# Patient Record
Sex: Male | Born: 1965 | Race: White | Hispanic: No | Marital: Single | Smoking: Light tobacco smoker
Health system: Southern US, Community
[De-identification: ages and names within clinical notes are randomized; demographics above are authoritative.]

## PROBLEM LIST (undated history)

## (undated) DIAGNOSIS — R260 Ataxic gait: Secondary | ICD-10-CM

## (undated) DIAGNOSIS — G7111 Myotonic muscular dystrophy: Principal | ICD-10-CM

## (undated) HISTORY — DX: Ataxic gait: R26.0

## (undated) HISTORY — DX: Myotonic muscular dystrophy: G71.11

## (undated) HISTORY — PX: CATARACT EXTRACTION: SUR2

---

## 1998-08-19 ENCOUNTER — Emergency Department (HOSPITAL_COMMUNITY): Admission: EM | Admit: 1998-08-19 | Discharge: 1998-08-19 | Payer: Self-pay | Admitting: Emergency Medicine

## 1998-08-19 ENCOUNTER — Encounter: Payer: Self-pay | Admitting: Emergency Medicine

## 1998-08-23 ENCOUNTER — Emergency Department (HOSPITAL_COMMUNITY): Admission: EM | Admit: 1998-08-23 | Discharge: 1998-08-23 | Payer: Self-pay | Admitting: Emergency Medicine

## 1998-08-29 ENCOUNTER — Emergency Department (HOSPITAL_COMMUNITY): Admission: EM | Admit: 1998-08-29 | Discharge: 1998-08-29 | Payer: Self-pay | Admitting: Emergency Medicine

## 1998-09-22 ENCOUNTER — Emergency Department (HOSPITAL_COMMUNITY): Admission: EM | Admit: 1998-09-22 | Discharge: 1998-09-22 | Payer: Self-pay | Admitting: Internal Medicine

## 2014-04-12 ENCOUNTER — Encounter: Payer: Self-pay | Admitting: *Deleted

## 2014-04-14 ENCOUNTER — Encounter: Payer: Self-pay | Admitting: Neurology

## 2014-04-14 ENCOUNTER — Ambulatory Visit (INDEPENDENT_AMBULATORY_CARE_PROVIDER_SITE_OTHER): Payer: BC Managed Care – PPO | Admitting: Neurology

## 2014-04-14 VITALS — BP 110/75 | HR 91 | Ht 68.0 in | Wt 162.0 lb

## 2014-04-14 DIAGNOSIS — M6281 Muscle weakness (generalized): Secondary | ICD-10-CM

## 2014-04-14 DIAGNOSIS — R269 Unspecified abnormalities of gait and mobility: Secondary | ICD-10-CM

## 2014-04-14 DIAGNOSIS — R260 Ataxic gait: Secondary | ICD-10-CM | POA: Insufficient documentation

## 2014-04-14 HISTORY — DX: Ataxic gait: R26.0
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
## 2014-04-14 NOTE — Patient Instructions (Signed)
Probable Myotonic dystrophy

## 2014-04-15 DIAGNOSIS — Z0289 Encounter for other administrative examinations: Secondary | ICD-10-CM

## 2014-04-15 LAB — TSH: TSH: 2.52 u[IU]/mL (ref 0.450–4.500)

## 2014-04-15 LAB — ANGIOTENSIN CONVERTING ENZYME: Angio Convert Enzyme: 37 U/L (ref 14–82)

## 2014-04-15 LAB — CK: Total CK: 175 U/L (ref 24–204)

## 2014-04-15 LAB — VITAMIN B12: Vitamin B-12: 304 pg/mL (ref 211–946)

## 2014-04-18 ENCOUNTER — Telehealth: Payer: Self-pay | Admitting: Neurology

## 2014-04-18 NOTE — Telephone Encounter (Signed)
I called patient. The patient wanted to know that I filled out the [facility] form. I did this, it should be sent in. I believe that he has myotonic dystrophy, but his CK enzyme level came back normal, putting this diagnosis in question. The patient will be set up for EMG evaluation.

## 2014-04-18 NOTE — Progress Notes (Signed)
Quick Note:  Called and shared unremarkable results with patient thru VM message ______ 

## 2014-04-20 DIAGNOSIS — Z0289 Encounter for other administrative examinations: Secondary | ICD-10-CM

## 2014-04-28 ENCOUNTER — Encounter: Payer: Self-pay | Admitting: *Deleted

## 2014-04-28 ENCOUNTER — Telehealth: Payer: Self-pay | Admitting: Neurology

## 2014-04-28 NOTE — Telephone Encounter (Signed)
Patient stated paperwork given to him at check has incorrect information.  States he's a current everyday smoker, but he states he smokes rarely.  He has additional questions regarding dx's.  Please return call anytime and can leave message if not available. s

## 2014-04-28 NOTE — Telephone Encounter (Signed)
Called patient back needing more information left patient a voice mail to call me back.

## 2014-04-28 NOTE — Telephone Encounter (Signed)
Patient called me back and he stated that he wanted his smoking history change to light smoker this has been done.

## 2014-06-30 ENCOUNTER — Telehealth: Payer: Self-pay | Admitting: *Deleted

## 2014-06-30 ENCOUNTER — Encounter: Payer: Self-pay | Admitting: Neurology

## 2014-06-30 NOTE — Telephone Encounter (Signed)
I called patient. The patient wants a letter concerning his diagnosis of myotonic dystrophy. The patient never came back for EMG evaluation to confirm the diagnosis. The diagnosis of myotonic history is a presumptive diagnosis only. I will dictate a letter.

## 2014-06-30 NOTE — Telephone Encounter (Signed)
Patient calling stating that he needs a letter stating his condition and how it affects him, mainly his walking ability. Please advise

## 2014-07-01 ENCOUNTER — Telehealth: Payer: Self-pay | Admitting: Neurology

## 2014-07-01 NOTE — Telephone Encounter (Signed)
Patient calling to state that he needs a letter from Dr. Anne HahnWillis stating that he is unable to walk in a straight line due to his condition. Please return call and advise.

## 2014-07-01 NOTE — Telephone Encounter (Signed)
A letter was already written, the patient is to come in and pick up the letter.

## 2014-11-07 ENCOUNTER — Telehealth: Payer: Self-pay | Admitting: Neurology

## 2014-11-07 NOTE — Telephone Encounter (Signed)
Patient stated he hadn't completed diagnostic testing for diagnosis.  Questioning when should he return.  Please call and advise.

## 2014-11-08 NOTE — Telephone Encounter (Signed)
Spoke to patient and relayed that he needs to schedule his EMG/NCV study to confirm diagnosis of myotonic dystrophy, per Dr. Anne HahnWillis.  The patient stated he is going out of town tomorrow and will call to schedule study when he returns.

## 2014-11-24 ENCOUNTER — Telehealth: Payer: Self-pay | Admitting: *Deleted

## 2014-11-24 NOTE — Telephone Encounter (Signed)
Patient calling stating that he wants to be reevaluated for his condition. Patient did not have the EMG done that was ordered. Patient is a little confused ans would like a call back. Please advise.

## 2014-11-25 NOTE — Telephone Encounter (Signed)
Left message to call office and schedule a NCV/EMG study first,  per Dr. Anne HahnWillis.  After the procedure the doctor can see him for an office visit.

## 2014-11-29 NOTE — Telephone Encounter (Signed)
EMG scheduled for 12/13/14.

## 2014-12-13 ENCOUNTER — Encounter: Payer: Self-pay | Admitting: Neurology

## 2014-12-13 ENCOUNTER — Ambulatory Visit (INDEPENDENT_AMBULATORY_CARE_PROVIDER_SITE_OTHER): Payer: Self-pay | Admitting: Neurology

## 2014-12-13 ENCOUNTER — Ambulatory Visit (INDEPENDENT_AMBULATORY_CARE_PROVIDER_SITE_OTHER): Payer: BLUE CROSS/BLUE SHIELD | Admitting: Neurology

## 2014-12-13 DIAGNOSIS — G7111 Myotonic muscular dystrophy: Secondary | ICD-10-CM

## 2014-12-13 DIAGNOSIS — R260 Ataxic gait: Secondary | ICD-10-CM

## 2014-12-13 DIAGNOSIS — M6281 Muscle weakness (generalized): Secondary | ICD-10-CM

## 2014-12-13 HISTORY — DX: Myotonic muscular dystrophy: G71.11

## 2014-12-13 NOTE — Procedures (Signed)
     HISTORY:  Mitchell Maldonado is a 49 year old gentleman with a history of progressive weakness of the extremities, gait disorder, dysarthric speech. The patient has clinical evidence of myotonia, difficulty with relaxation after strong muscle contraction. The patient is being evaluated for possible myotonic dystrophy.  NERVE CONDUCTION STUDIES:  Nerve conduction studies were performed on the right upper extremity. The distal motor latencies and motor amplitudes for the median and ulnar nerves were within normal limits. The F wave latencies and nerve conduction velocities for these nerves were also normal. The sensory latencies for the median and ulnar nerves were normal.  Nerve conduction studies were performed on the right lower extremity. The distal motor latencies and motor amplitudes for the peroneal and posterior tibial nerves were within normal limits. The nerve conduction velocities for these nerves were also normal. The F wave latencies for the peroneal and posterior tibial nerves were normal. The sensory latency for the peroneal nerve was within normal limits.   EMG STUDIES:  A limited EMG study was performed on the right upper extremity:  The first dorsal interosseous muscle reveals 2 to 4 K units with full recruitment. Myotonia was seen. The abductor pollicis brevis muscle reveals 2 to 4 K units with full recruitment. Myotonia was seen. The extensor indicis proprius muscle reveals 1 to 3 K units with full recruitment. Myotonia was seen.   IMPRESSION:  Nerve conduction studies done on the right upper and right lower extremities were relatively unremarkable, without evidence of a peripheral neuropathy. EMG evaluation of the right upper extremity was limited, and confirm the presence of myotonia, consistent with the diagnosis of myotonic dystrophy.  Marlan Palau. Keith Willis MD 12/13/2014 1:57 PM  [facility] [address] [place], [place]  [id number]  Phone [phone] Fax [phone]

## 2014-12-13 NOTE — Progress Notes (Signed)
Please refer to EMG and nerve conduction study procedure note. 

## 2014-12-13 NOTE — Progress Notes (Signed)
Mitchell Maldonado comes in today for EMG nerve conduction study. The study confirms myotonia consistent with the diagnosis of myotonic dystrophy.  The patient should follow-up on a regular basis with his primary care physician to check EKG evaluation, individuals of myotonic dystrophy may be at risk for cardiac rhythm abnormalities, particularly atrial fibrillation.

## 2015-02-16 ENCOUNTER — Telehealth: Payer: Self-pay | Admitting: Neurology

## 2015-02-16 NOTE — Telephone Encounter (Signed)
Error

## 2015-04-10 ENCOUNTER — Telehealth: Payer: Self-pay | Admitting: Neurology

## 2015-04-10 NOTE — Telephone Encounter (Signed)
Patient is inquiring which type of MD does he have... 1 or 2. Please call and advise. Patient can be reached at [phone].

## 2015-04-10 NOTE — Telephone Encounter (Signed)
I called the patient and informed him it is type 1.

## 2016-04-16 DIAGNOSIS — R152 Fecal urgency: Secondary | ICD-10-CM | POA: Diagnosis not present

## 2016-04-18 DIAGNOSIS — R197 Diarrhea, unspecified: Secondary | ICD-10-CM | POA: Diagnosis not present

## 2016-04-18 DIAGNOSIS — R152 Fecal urgency: Secondary | ICD-10-CM | POA: Diagnosis not present

## 2016-04-18 DIAGNOSIS — R159 Full incontinence of feces: Secondary | ICD-10-CM | POA: Diagnosis not present

## 2016-04-22 DIAGNOSIS — R159 Full incontinence of feces: Secondary | ICD-10-CM | POA: Diagnosis not present

## 2016-10-29 DIAGNOSIS — G7111 Myotonic muscular dystrophy: Secondary | ICD-10-CM | POA: Diagnosis not present

## 2016-10-31 DIAGNOSIS — Z139 Encounter for screening, unspecified: Secondary | ICD-10-CM | POA: Diagnosis not present

## 2016-11-01 DIAGNOSIS — H5212 Myopia, left eye: Secondary | ICD-10-CM | POA: Diagnosis not present

## 2016-11-20 DIAGNOSIS — H01001 Unspecified blepharitis right upper eyelid: Secondary | ICD-10-CM | POA: Diagnosis not present

## 2017-08-01 DIAGNOSIS — J3089 Other allergic rhinitis: Secondary | ICD-10-CM | POA: Diagnosis not present

## 2017-08-01 DIAGNOSIS — R05 Cough: Secondary | ICD-10-CM | POA: Diagnosis not present

## 2018-03-20 DIAGNOSIS — Z0000 Encounter for general adult medical examination without abnormal findings: Secondary | ICD-10-CM | POA: Diagnosis not present

## 2018-03-20 DIAGNOSIS — Z13220 Encounter for screening for lipoid disorders: Secondary | ICD-10-CM | POA: Diagnosis not present

## 2018-03-20 DIAGNOSIS — Z8042 Family history of malignant neoplasm of prostate: Secondary | ICD-10-CM | POA: Diagnosis not present

## 2018-03-20 DIAGNOSIS — H6123 Impacted cerumen, bilateral: Secondary | ICD-10-CM | POA: Diagnosis not present

## 2018-11-04 DIAGNOSIS — G7111 Myotonic muscular dystrophy: Secondary | ICD-10-CM | POA: Diagnosis not present

## 2019-07-13 DIAGNOSIS — H524 Presbyopia: Secondary | ICD-10-CM | POA: Diagnosis not present

## 2019-07-13 DIAGNOSIS — H35373 Puckering of macula, bilateral: Secondary | ICD-10-CM | POA: Diagnosis not present

## 2021-01-26 ENCOUNTER — Other Ambulatory Visit: Payer: Self-pay

## 2021-01-26 ENCOUNTER — Emergency Department (HOSPITAL_COMMUNITY)
Admission: EM | Admit: 2021-01-26 | Discharge: 2021-01-27 | Disposition: A | Discharge status: Home / Self Care | Payer: BC Managed Care – PPO | Attending: Emergency Medicine | Admitting: Emergency Medicine

## 2021-01-26 DIAGNOSIS — J984 Other disorders of lung: Secondary | ICD-10-CM | POA: Diagnosis not present

## 2021-01-26 DIAGNOSIS — R0602 Shortness of breath: Secondary | ICD-10-CM | POA: Diagnosis not present

## 2021-01-26 DIAGNOSIS — I499 Cardiac arrhythmia, unspecified: Secondary | ICD-10-CM | POA: Diagnosis not present

## 2021-01-26 DIAGNOSIS — R079 Chest pain, unspecified: Secondary | ICD-10-CM | POA: Diagnosis not present

## 2021-01-26 DIAGNOSIS — F17210 Nicotine dependence, cigarettes, uncomplicated: Secondary | ICD-10-CM | POA: Insufficient documentation

## 2021-01-26 DIAGNOSIS — K8020 Calculus of gallbladder without cholecystitis without obstruction: Secondary | ICD-10-CM | POA: Diagnosis not present

## 2021-01-26 DIAGNOSIS — R112 Nausea with vomiting, unspecified: Secondary | ICD-10-CM | POA: Diagnosis not present

## 2021-01-26 DIAGNOSIS — I491 Atrial premature depolarization: Secondary | ICD-10-CM | POA: Diagnosis not present

## 2021-01-26 DIAGNOSIS — R0789 Other chest pain: Secondary | ICD-10-CM | POA: Diagnosis not present

## 2021-01-26 DIAGNOSIS — R1084 Generalized abdominal pain: Secondary | ICD-10-CM | POA: Diagnosis not present

## 2021-01-26 DIAGNOSIS — R109 Unspecified abdominal pain: Secondary | ICD-10-CM | POA: Diagnosis not present

## 2021-01-26 NOTE — ED Triage Notes (Signed)
Patient BIB [facility] for chest pain and shortness of breath, denies medical hx or daily medications

## 2021-01-26 NOTE — ED Provider Notes (Signed)
MSE was initiated and I personally evaluated the patient and placed orders (if any) at  11:19 PM on Jan 26, 2021.  Chest pain at rest one hour ago. Getting better after taking "stomache relief". Had nausea with dry heaving. Radiated to the back. No heart history. No recent fever or cough.   RRR Lungs clear  The patient appears stable so that the remainder of the MSE may be completed by another provider.   Elpidio Anis, PA-C 01/26/21 2322    Gilda Crease, MD 01/27/21 [phone]

## 2021-01-27 ENCOUNTER — Emergency Department (HOSPITAL_COMMUNITY): Payer: BC Managed Care – PPO

## 2021-01-27 DIAGNOSIS — J984 Other disorders of lung: Secondary | ICD-10-CM | POA: Diagnosis not present

## 2021-01-27 DIAGNOSIS — K8020 Calculus of gallbladder without cholecystitis without obstruction: Secondary | ICD-10-CM | POA: Diagnosis not present

## 2021-01-27 DIAGNOSIS — R109 Unspecified abdominal pain: Secondary | ICD-10-CM | POA: Diagnosis not present

## 2021-01-27 DIAGNOSIS — R079 Chest pain, unspecified: Secondary | ICD-10-CM | POA: Diagnosis not present

## 2021-01-27 LAB — COMPREHENSIVE METABOLIC PANEL
ALT: 29 U/L (ref 0–44)
AST: 58 U/L — ABNORMAL HIGH (ref 15–41)
Albumin: 3.7 g/dL (ref 3.5–5.0)
Alkaline Phosphatase: 100 U/L (ref 38–126)
Anion gap: 8 (ref 5–15)
BUN: 12 mg/dL (ref 6–20)
CO2: 28 mmol/L (ref 22–32)
Calcium: 9.2 mg/dL (ref 8.9–10.3)
Chloride: 107 mmol/L (ref 98–111)
Creatinine, Ser: 0.94 mg/dL (ref 0.61–1.24)
GFR, Estimated: >60 mL/min (ref >60)
Glucose, Bld: 136 mg/dL — ABNORMAL HIGH (ref 70–99)
Potassium: 4 mmol/L (ref 3.5–5.1)
Sodium: 143 mmol/L (ref 135–145)
Total Bilirubin: 0.8 mg/dL (ref 0.3–1.2)
Total Protein: 6.8 g/dL (ref 6.5–8.1)

## 2021-01-27 LAB — CBC WITH DIFFERENTIAL/PLATELET
Abs Immature Granulocytes: 0.04 10*3/uL (ref 0.00–0.07)
Basophils Absolute: 0 10*3/uL (ref 0.0–0.1)
Basophils Relative: 0 %
Eosinophils Absolute: 0 10*3/uL (ref 0.0–0.5)
Eosinophils Relative: 1 %
HCT: 46.2 % (ref 39.0–52.0)
Hemoglobin: 15.3 g/dL (ref 13.0–17.0)
Immature Granulocytes: 1 %
Lymphocytes Relative: 13 %
Lymphs Abs: 1.1 10*3/uL (ref 0.7–4.0)
MCH: 28.2 pg (ref 26.0–34.0)
MCHC: 33.1 g/dL (ref 30.0–36.0)
MCV: 85.1 fL (ref 80.0–100.0)
Monocytes Absolute: 0.3 10*3/uL (ref 0.1–1.0)
Monocytes Relative: 4 %
Neutro Abs: 7.2 10*3/uL (ref 1.7–7.7)
Neutrophils Relative %: 81 %
Platelets: 185 10*3/uL (ref 150–400)
RBC: 5.43 MIL/uL (ref 4.22–5.81)
RDW: 12.7 % (ref 11.5–15.5)
WBC: 8.8 10*3/uL (ref 4.0–10.5)
nRBC: 0 % (ref 0.0–0.2)

## 2021-01-27 LAB — TROPONIN I (HIGH SENSITIVITY)
Troponin I (High Sensitivity): 7 ng/L (ref <18)
Troponin I (High Sensitivity): 10 ng/L (ref <18)

## 2021-01-27 LAB — LIPASE, BLOOD: Lipase: 24 U/L (ref 11–51)

## 2021-01-27 MED ORDER — IOHEXOL 350 MG/ML SOLN
75.0000 mL | Freq: Once | INTRAVENOUS | Status: AC | PRN
  Administered 2021-01-27: 75 mL via INTRAVENOUS

## 2021-01-27 MED ORDER — ONDANSETRON HCL 4 MG/2ML IJ SOLN
4.0000 mg | Freq: Once | INTRAMUSCULAR | Status: AC
  Administered 2021-01-27: 4 mg via INTRAVENOUS
  Filled 2021-01-27: qty 2

## 2021-01-27 MED ORDER — SODIUM CHLORIDE 0.9 % IV BOLUS
500.0000 mL | Freq: Once | INTRAVENOUS | Status: AC
  Administered 2021-01-27: 500 mL via INTRAVENOUS

## 2021-01-27 NOTE — ED Notes (Signed)
Pt given scrubs pants

## 2021-01-27 NOTE — ED Provider Notes (Signed)
[facility] Provider Note   CSN: [id number] Arrival date & time: 01/26/21  2317     History Chief Complaint  Patient presents with  . Chest Pain    Mitchell Maldonado is a 55 y.o. male.  Patient presents to the emergency department multiple complaints.  Patient reports that he started feeling sick to his stomach last night.  He has had nausea, vomiting and dry heaves through the course of today.  He reports that his stomach is uncomfortable, but no specific pain.  He has not had any diarrhea.  Patient reports that he developed pain more up into his chest and back tonight, associated with shortness of breath.  Nausea, abdominal discomfort, chest pain, shortness of breath of improved or resolved but he is still experiencing pain in the back.        Past Medical History:  Diagnosis Date  . 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 CATARACT EXTRACTION Bilateral        Family History  Problem Relation Age of Onset  . Prostate cancer Father   . Breast cancer Mother     Social History   Tobacco Use  . Smoking status: Light Tobacco Smoker    Types: Cigarettes  . Smokeless tobacco: Never Used  . Tobacco comment: 3 packs per year  Substance Use Topics  . Alcohol use: Yes    Comment: occasional  . Drug use: No    Home Medications Prior to Admission medications   Medication Sig Start Date End Date Taking? Authorizing Provider  Bismuth Subsalicylate (STOMACH RELIEF PO) Take 3 tablets by mouth daily as needed (stomach pain).   Yes [provider]    Allergies    Ham  Review of Systems   Review of Systems  Respiratory: Positive for shortness of breath.   Cardiovascular: Positive for chest pain.   Gastrointestinal: Positive for abdominal pain, nausea and vomiting.  All other systems reviewed and are negative.   Physical Exam Updated Vital Signs BP 125/73 (BP Location: Right Arm)   Pulse 73   Temp 97.9 F (36.6 C) (Oral)   Resp (!) 23   Ht 5\' 8"  (1.727 m)   Wt 73.5 kg   SpO2 98%   BMI 24.64 kg/m   Physical Exam Vitals and nursing note reviewed.  Constitutional:      General: He is not in acute distress.    Appearance: Normal appearance. He is well-developed.  HENT:     Head: Normocephalic and atraumatic.     Right Ear: Hearing normal.     Left Ear: Hearing normal.     Nose: Nose normal.  Eyes:     Conjunctiva/sclera: Conjunctivae normal.     Pupils: Pupils are equal, round, and reactive to light.  Cardiovascular:     Rate and Rhythm: Regular rhythm.     Heart sounds: S1 normal and S2 normal. No murmur heard. No friction rub. No gallop.   Pulmonary:     Effort: Pulmonary effort is normal. No respiratory distress.     Breath sounds: Normal breath sounds.  Chest:     Chest wall: No tenderness.  Abdominal:     General: Bowel sounds are normal.     Palpations:  Abdomen is soft.     Tenderness: There is no abdominal tenderness. There is no guarding or rebound. Negative signs include Murphy's sign and McBurney's sign.     Hernia: No hernia is present.  Musculoskeletal:        General: Normal range of motion.     Cervical back: Normal range of motion and neck supple.  Skin:    General: Skin is warm and dry.     Findings: No rash.  Neurological:     Mental Status: He is alert and oriented to person, place, and time.     GCS: GCS eye subscore is 4. GCS verbal subscore is 5. GCS motor subscore is 6.     Cranial Nerves: No cranial nerve deficit.     Sensory: No sensory deficit.     Coordination: Coordination normal.  Psychiatric:        Speech: Speech normal.        Behavior: Behavior normal.        Thought Content: Thought content normal.     ED Results /  Procedures / Treatments   Labs (all labs ordered are listed, but only abnormal results are displayed) Labs Reviewed  COMPREHENSIVE METABOLIC PANEL - Abnormal; Notable for the following components:      Result Value   Glucose, Bld 136 (*)    AST 58 (*)    All other components within normal limits  CBC WITH DIFFERENTIAL/PLATELET  LIPASE, BLOOD  TROPONIN I (HIGH SENSITIVITY)  TROPONIN I (HIGH SENSITIVITY)    EKG EKG Interpretation  Date/Time:  Friday Jan 26 2021 23:28:32 EDT Ventricular Rate:  82 PR Interval:    QRS Duration: 90 QT Interval:  392 QTC Calculation: 457 R Axis:   -19 Text Interpretation: Undetermined rhythm Nonspecific T wave abnormality Abnormal ECG No previous tracing Reconfirmed by Gilda Crease [phone]) on 01/27/2021 7:45:27 AM   Radiology DG Chest 2 View  Result Date: 01/27/2021 CLINICAL DATA:  Chest pain EXAM: CHEST - 2 VIEW COMPARISON:  November 23, 2014 FINDINGS: The heart size and mediastinal contours are within normal limits. Left basilar linear atelectasis versus scarring. No focal consolidation. No pleural effusion. No pneumothorax. The visualized skeletal structures are unremarkable. IMPRESSION: No active cardiopulmonary disease. Left basilar linear atelectasis versus scarring. Electronically Signed   By: Maudry Mayhew MD   On: 01/27/2021 00:32   CT ANGIO CHEST/ABD/PEL FOR DISSECTION W &/OR WO CONTRAST  Result Date: 01/27/2021 CLINICAL DATA:  Abdominal pain aortic dissection suspected EXAM: CT ANGIOGRAPHY CHEST, ABDOMEN AND PELVIS TECHNIQUE: Non-contrast CT of the chest was initially obtained. Multidetector CT imaging through the chest, abdomen and pelvis was performed using the standard protocol during bolus administration of intravenous contrast. Multiplanar reconstructed images and MIPs were obtained and reviewed to evaluate the vascular anatomy. CONTRAST:  52mL OMNIPAQUE IOHEXOL 350 MG/ML SOLN COMPARISON:  None. FINDINGS: CTA CHEST FINDINGS  Cardiovascular: No intramural hematoma visualized on non contrasted sequence. Mild thoracic aortic atherosclerosis. Preferential opacification of the thoracic aorta. No evidence of thoracic aortic aneurysm or dissection. Normal heart size. No pericardial effusion. No suspicious intracardiac filling defect. No central pulmonary embolus. Mediastinum/Nodes: Fluid-filled patulous esophagus. No discrete thyroid nodularity. No pathologically enlarged mediastinal, hilar or axillary lymph nodes. Trachea is grossly unremarkable. Lungs/Pleura: Dependent atelectasis versus scarring. Mild biapical pleuroparenchymal scarring. No focal consolidation. No pleural effusion. No pneumothorax. Musculoskeletal: No chest wall abnormality. No acute or significant osseous findings. Review of the MIP images confirms the above findings. CTA ABDOMEN AND PELVIS FINDINGS  VASCULAR Aorta: Normal caliber aorta without aneurysm, dissection, vasculitis or significant stenosis. Celiac: Patent without evidence of aneurysm, dissection, vasculitis or significant stenosis. SMA: Patent without evidence of aneurysm, dissection, vasculitis or significant stenosis. Renals: Both renal arteries are patent without evidence of aneurysm, dissection, vasculitis, fibromuscular dysplasia or significant stenosis. IMA: Patent without evidence of aneurysm, dissection, vasculitis or significant stenosis. Inflow: Patent without evidence of aneurysm, dissection, vasculitis or significant stenosis. Veins: No obvious venous abnormality within the limitations of this arterial phase study. Review of the MIP images confirms the above findings. NON-VASCULAR Hepatobiliary: No suspicious hepatic lesion. Cholelithiasis with mild pericholecystic fluid. No biliary ductal dilation. Pancreas: There is mild fat stranding at the of the head of the pancreas at the pancreaticoduodenal groove. No pancreatic ductal dilation. Spleen: Within normal limits Adrenals/Urinary Tract: Adrenal  glands are unremarkable. Kidneys are normal, without renal calculi, focal lesion, or hydronephrosis. Bladder is unremarkable for degree of distension. Stomach/Bowel: Serpiginous hyperdense bands visualized throughout the stomach and with more focal collection of hyperdense material visualized in loops of bowel within the right upper abdomen on pre contrast imaging which does not increase on postcontrast imaging and is too dense to represent blood products, favored likely to represent ingested contents. No gastric wall thickening. No pathologic dilation of small bowel. No suspicious colonic wall thickening. Lymphatic: No pathologically enlarged abdominal or pelvic lymph nodes. Reproductive: Dystrophic calcifications in a mildly enlarged prostate Other: No abdominopelvic ascites.  No pneumoperitoneum. Musculoskeletal: No acute osseous abnormality. Review of the MIP images confirms the above findings. IMPRESSION: 1. No evidence of aortic aneurysm or dissection. 2. Cholelithiasis with mild pericholecystic fluid. If there is concern for acute cholecystitis, recommend further evaluation with right upper quadrant ultrasound. 3. Mild fat stranding at the of the head of the pancreas at the pancreaticoduodenal groove, suggestive of acute groove pancreatitis. Correlation with serum lipase is suggested. 4. Fluid-filled patulous esophagus suggestive of dysmotility. 5. Aortic atherosclerosis. Aortic Atherosclerosis (ICD10-I70.0). Electronically Signed   By: Maudry Mayhew MD   On: 01/27/2021 04:02   US ABDOMEN LIMITED RUQ (LIVER/GB)  Result Date: 01/27/2021 CLINICAL DATA:  Abdominal pain EXAM: ULTRASOUND ABDOMEN LIMITED RIGHT UPPER QUADRANT COMPARISON:  CTA chest, abdomen and pelvis from earlier today FINDINGS: Gallbladder: Gallstones are identified which measure up to 1.6 cm. Gallbladder wall appears mildly thickened measuring 3.5 mm. No pericholecystic fluid. Negative sonographic Murphy's sign. Common bile duct: Diameter:  2.5 mm Liver: No focal lesion identified. Within normal limits in parenchymal echogenicity. Portal vein is patent on color Doppler imaging with normal direction of blood flow towards the liver. Other: Exam detail is diminished due to poor sonographic acoustic windows and increased bowel gas. IMPRESSION: 1. Diminished exam detail due to increased bowel gas. 2. Gallstones and mild gallbladder wall thickening. Electronically Signed   By: Signa Kell M.D.   On: 01/27/2021 05:51    Procedures Procedures   Medications Ordered in ED Medications  sodium chloride 0.9 % bolus 500 mL (0 mLs Intravenous Stopped 01/27/21 0329)  ondansetron (ZOFRAN) injection 4 mg (4 mg Intravenous Given 01/27/21 0244)  iohexol (OMNIPAQUE) 350 MG/ML injection 75 mL (75 mLs Intravenous Contrast Given 01/27/21 0323)    ED Course  I have reviewed the triage vital signs and the nursing notes.  Pertinent labs & imaging results that were available during my care of the patient were reviewed by me and considered in my medical decision making (see chart for details).    MDM Rules/Calculators/A&P  Patient presents to the emergency department for evaluation of nausea, vomiting, abdominal pain.  Pain radiated into the chest and into his back as well.  Patient does have some difficulty relaying all of his symptoms and the chronology.  Extensive work-up has been performed.  CT angio chest abdomen pelvis was performed because of his symptomatology.  No aortic dissection or AAA noted.  There was, however, a gallstone noted.  Ultrasound shows slight thickening of the gallbladder wall but there was no sonographic Murphy sign and he does not have any pericholecystic fluid.  Recheck reveals that he is not having any pain currently.  He likely has been experiencing biliary colic causing his symptoms.  Patient will be discharged and is to follow-up with general surgery as an outpatient, return if symptoms worsen.  Final  Clinical Impression(s) / ED Diagnoses Final diagnoses:  Calculus of gallbladder without cholecystitis without obstruction    Rx / DC Orders ED Discharge Orders    None       Gilda Crease, MD 01/27/21 0745

## 2021-01-27 NOTE — Discharge Instructions (Signed)
Call [facility] Monday morning.  Tell them that you were in the emergency room and have a gallstone, need a follow-up appointment as soon as possible.  If your symptoms worsen, return to the ER for repeat evaluation.

## 2021-03-28 DIAGNOSIS — R21 Rash and other nonspecific skin eruption: Secondary | ICD-10-CM | POA: Diagnosis not present

## 2021-09-07 ENCOUNTER — Encounter (HOSPITAL_COMMUNITY): Payer: Self-pay

## 2021-09-07 ENCOUNTER — Emergency Department (HOSPITAL_COMMUNITY)
Admission: EM | Admit: 2021-09-07 | Discharge: 2021-09-08 | Disposition: A | Discharge status: Home / Self Care | Payer: BC Managed Care – PPO | Attending: Emergency Medicine | Admitting: Emergency Medicine

## 2021-09-07 ENCOUNTER — Other Ambulatory Visit: Payer: Self-pay

## 2021-09-07 DIAGNOSIS — F17210 Nicotine dependence, cigarettes, uncomplicated: Secondary | ICD-10-CM | POA: Diagnosis not present

## 2021-09-07 DIAGNOSIS — T68XXXA Hypothermia, initial encounter: Secondary | ICD-10-CM | POA: Insufficient documentation

## 2021-09-07 DIAGNOSIS — X31XXXA Exposure to excessive natural cold, initial encounter: Secondary | ICD-10-CM | POA: Diagnosis not present

## 2021-09-07 DIAGNOSIS — R9431 Abnormal electrocardiogram [ECG] [EKG]: Secondary | ICD-10-CM | POA: Diagnosis not present

## 2021-09-07 NOTE — ED Notes (Signed)
Bair hugger applied.

## 2021-09-07 NOTE — ED Provider Notes (Signed)
[facility]-EMERGENCY DEPT Provider Note   CSN: [id number] Arrival date & time: 09/07/21  2255     History Chief Complaint  Patient presents with   Cold Exposure    Mitchell Maldonado is a 55 y.o. male.  The history is provided by the patient, the EMS personnel and medical records.  Mitchell Maldonado is a 54 y.o. male who presents to the Emergency Department complaining of cold exposure.  He presents to the emergency department by EMS for evaluation following cold exposure.  He states that his door is often jammed and he could not get into his apartment.  He also tried to get back into his car and the door was stuck as well and he was outside in the cold.  He estimates that he was outside for about 2 hours.  He was having difficulty speaking secondary to the cold exposure.  Police Department happened to find him outside and called EMS.  Patient denies any recent illnesses.  No alcohol or drug use.  Occasional tobacco use.  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  Physical Exam Updated Vital Signs BP 125/75     Pulse 76    Temp 97.9 F (36.6 C) (Oral)    Resp 20    SpO2 100%   Physical Exam Vitals and nursing note reviewed.  Constitutional:      Appearance: He is well-developed.     Comments: shivering, temporal wasting  HENT:     Head: Normocephalic and atraumatic.  Cardiovascular:     Rate and Rhythm: Normal rate and regular rhythm.     Heart sounds: No murmur heard. Pulmonary:     Effort: Pulmonary effort is normal. No respiratory distress.     Breath sounds: Normal breath sounds.  Abdominal:     Palpations: Abdomen is soft.     Tenderness: There is no abdominal tenderness. There is no guarding or rebound.  Musculoskeletal:        General: No tenderness.  Skin:    General: Skin is dry.     Comments: Cool extremities  Neurological:     Mental Status: He is alert and oriented to person, place, and time.     Comments: Mild global weakness  Psychiatric:        Behavior: Behavior normal.    ED Results / Procedures / Treatments   Labs (all labs ordered are listed,  but only abnormal results are displayed) Labs Reviewed  COMPREHENSIVE METABOLIC PANEL - Abnormal; Notable for the following components:      Result Value   Potassium 3.2 (*)    All other components within normal limits  URINALYSIS, ROUTINE W REFLEX MICROSCOPIC - Abnormal; Notable for the following components:   Bacteria, UA RARE (*)    Crystals PRESENT (*)    All other components within normal limits  CBC WITH DIFFERENTIAL/PLATELET    EKG EKG Interpretation  Date/Time:  Saturday September 08 2021 01:31:31 EST Ventricular Rate:  80 PR Interval:  189 QRS Duration: 85 QT Interval:  386 QTC Calculation: 446 R Axis:   -24 Text Interpretation: Sinus rhythm Borderline left axis deviation Confirmed by Tilden Fossa [phone]) on 09/08/2021 1:42:58 AM  Radiology No results found.  Procedures Procedures   Medications Ordered in ED Medications  lactated ringers bolus 500 mL (0 mLs Intravenous Stopped 09/08/21  0245)  potassium chloride SA (KLOR-CON M) CR tablet 40 mEq (40 mEq Oral Given 09/08/21 0138)    ED Course  I have reviewed the triage vital signs and the nursing notes.  Pertinent labs & imaging results that were available during my care of the patient were reviewed by me and considered in my medical decision making (see chart for details).    MDM Rules/Calculators/A&P                         Patient with history of myotonic dystrophy here for evaluation of cold exposure after being stuck outside of his apartment and below freezing temperatures for 2 hours.  Patient shivering on ED presentation, hypothermic.  He was rewarmed with bear hugger and treated with IV fluids, potassium replacement.  On reassessment in the emergency department he is feeling improved, shivering has resolved.  He was observed for several hours.  Plan to discharge home.  Patient will need assistance with being able to get into his home.  Case management assistance requested with securing safe method into the home prior to patient leaving the department.    Final Clinical Impression(s) / ED Diagnoses Final diagnoses:  Accidental hypothermia, initial encounter    Rx / DC Orders ED Discharge Orders     None        Tilden Fossa, MD 09/08/21 [phone]

## 2021-09-07 NOTE — ED Triage Notes (Signed)
Patient BIB [facility] from home (outside the home). Patient locked outside his house. Patient has ice coming from his nose and beard. [employer] found patient. He does not know how long he has been outside.

## 2021-09-08 LAB — CBC WITH DIFFERENTIAL/PLATELET
Abs Immature Granulocytes: 0.02 10*3/uL (ref 0.00–0.07)
Basophils Absolute: 0 10*3/uL (ref 0.0–0.1)
Basophils Relative: 0 %
Eosinophils Absolute: 0.1 10*3/uL (ref 0.0–0.5)
Eosinophils Relative: 1 %
HCT: 45.3 % (ref 39.0–52.0)
Hemoglobin: 15.1 g/dL (ref 13.0–17.0)
Immature Granulocytes: 0 %
Lymphocytes Relative: 13 %
Lymphs Abs: 1.1 10*3/uL (ref 0.7–4.0)
MCH: 28.1 pg (ref 26.0–34.0)
MCHC: 33.3 g/dL (ref 30.0–36.0)
MCV: 84.4 fL (ref 80.0–100.0)
Monocytes Absolute: 0.5 10*3/uL (ref 0.1–1.0)
Monocytes Relative: 6 %
Neutro Abs: 7.1 10*3/uL (ref 1.7–7.7)
Neutrophils Relative %: 80 %
Platelets: 188 10*3/uL (ref 150–400)
RBC: 5.37 MIL/uL (ref 4.22–5.81)
RDW: 13.1 % (ref 11.5–15.5)
WBC: 8.8 10*3/uL (ref 4.0–10.5)
nRBC: 0 % (ref 0.0–0.2)

## 2021-09-08 LAB — COMPREHENSIVE METABOLIC PANEL
ALT: 29 U/L (ref 0–44)
AST: 29 U/L (ref 15–41)
Albumin: 3.7 g/dL (ref 3.5–5.0)
Alkaline Phosphatase: 112 U/L (ref 38–126)
Anion gap: 9 (ref 5–15)
BUN: 10 mg/dL (ref 6–20)
CO2: 25 mmol/L (ref 22–32)
Calcium: 9 mg/dL (ref 8.9–10.3)
Chloride: 109 mmol/L (ref 98–111)
Creatinine, Ser: 0.69 mg/dL (ref 0.61–1.24)
GFR, Estimated: >60 mL/min (ref >60)
Glucose, Bld: 97 mg/dL (ref 70–99)
Potassium: 3.2 mmol/L — ABNORMAL LOW (ref 3.5–5.1)
Sodium: 143 mmol/L (ref 135–145)
Total Bilirubin: 0.8 mg/dL (ref 0.3–1.2)
Total Protein: 6.8 g/dL (ref 6.5–8.1)

## 2021-09-08 LAB — URINALYSIS, ROUTINE W REFLEX MICROSCOPIC
Bilirubin Urine: NEGATIVE
Glucose, UA: NEGATIVE mg/dL
Hgb urine dipstick: NEGATIVE
Ketones, ur: NEGATIVE mg/dL
Leukocytes,Ua: NEGATIVE
Nitrite: NEGATIVE
Protein, ur: NEGATIVE mg/dL
Specific Gravity, Urine: 1.02 (ref 1.005–1.030)
pH: 6 (ref 5.0–8.0)

## 2021-09-08 MED ORDER — LACTATED RINGERS IV BOLUS
500.0000 mL | Freq: Once | INTRAVENOUS | Status: AC
  Administered 2021-09-08: 02:00:00 500 mL via INTRAVENOUS

## 2021-09-08 MED ORDER — POTASSIUM CHLORIDE CRYS ER 20 MEQ PO TBCR
40.0000 meq | EXTENDED_RELEASE_TABLET | Freq: Once | ORAL | Status: AC
  Administered 2021-09-08: 02:00:00 40 meq via ORAL
  Filled 2021-09-08: qty 2

## 2021-09-08 NOTE — Progress Notes (Signed)
CSW spoke with patient who stated his apartment door is jammed and he cannot get in. CSW asked if he contacted maintenance at his apartment complex and he stated several times. CSW asked patient if he had any friends or family he could stay with and he stated no. Patient stated he wants to stay in the ED. CSW explained that was not an option. CSW stated he can stay with friends/family, pay for a hotel, or stay at the [facility]. CSW explained the [facility] is open 24/7 this weekend due to the weather and the homeless population. CSW observed patients car keys and apartment keys on his table. Patient stated his car is frozen and he can't open the door. CSW provided patient with a list of hotels, addresses, and phones numbers for him to call until he can get in touch with Maintenance. Patient also provided with the address to the [facility]

## 2021-09-08 NOTE — ED Notes (Signed)
Patient able to ambulate to bathroom and back, no assistance needed.

## 2022-10-19 IMAGING — US US ABDOMEN LIMITED RUQ/ASCITES
1 series · 14 of 25 positions shown · non-contrast
Comparison: CTA chest, abdomen and pelvis from earlier today

CLINICAL DATA: Abdominal pain

EXAM:
ULTRASOUND ABDOMEN LIMITED RIGHT UPPER QUADRANT

[Series 1: us abdomen limited ruq (liver/gb) · 14 of 46 slices shown]
[im 1/46]
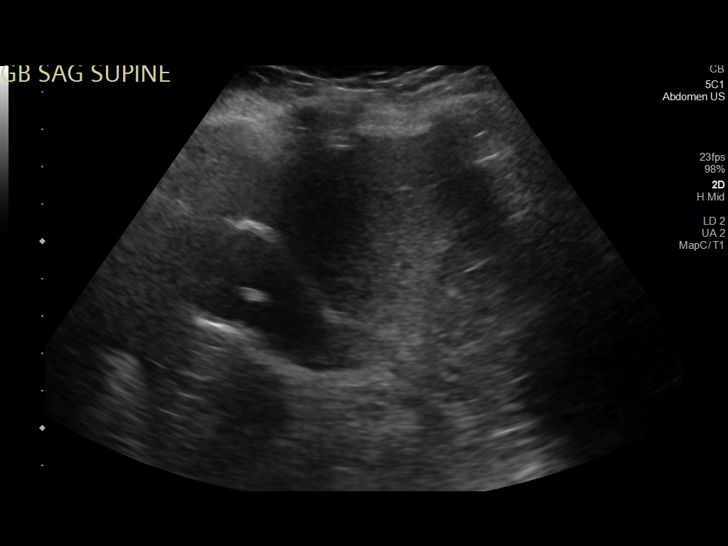
[im 4/46]
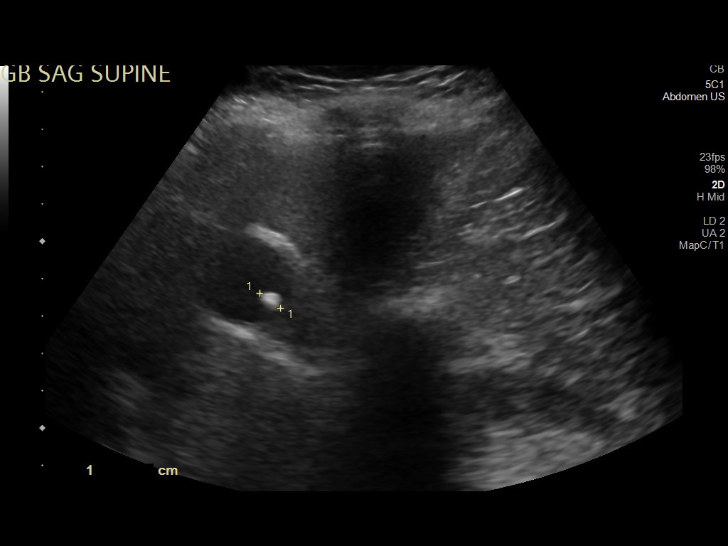
[im 8/46]
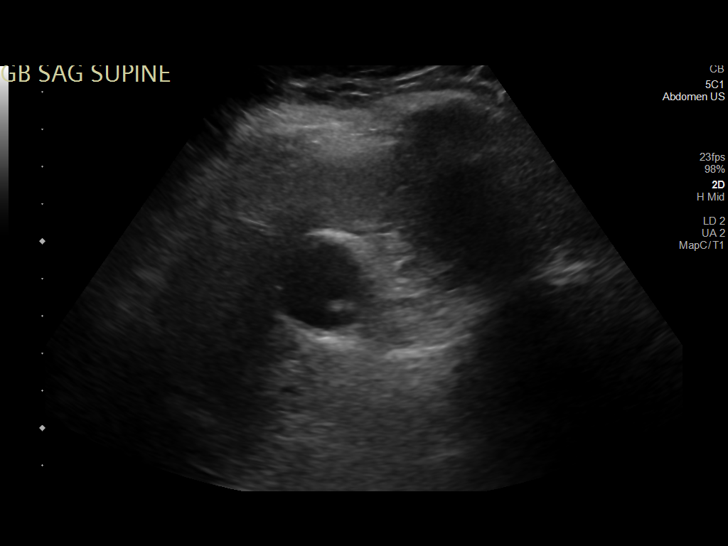
[im 12/46]
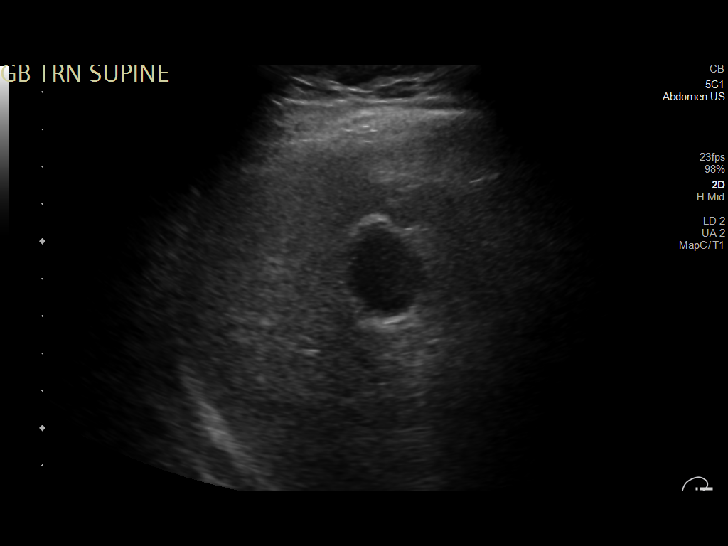
[im 16/46]
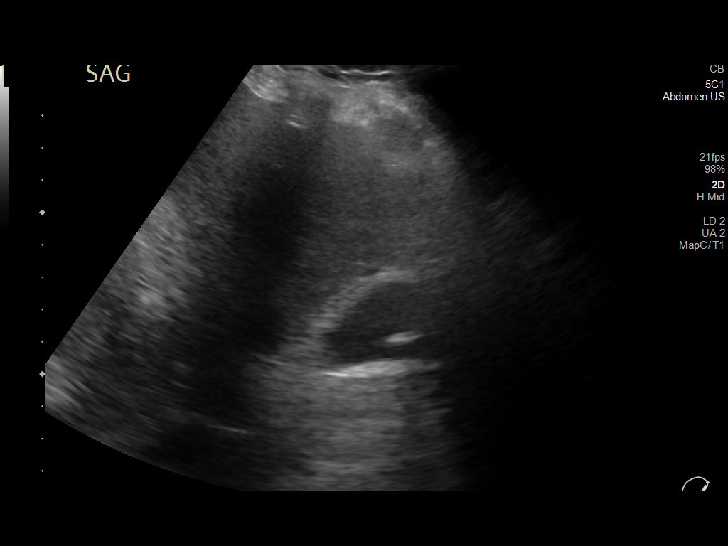
[im 17/46]
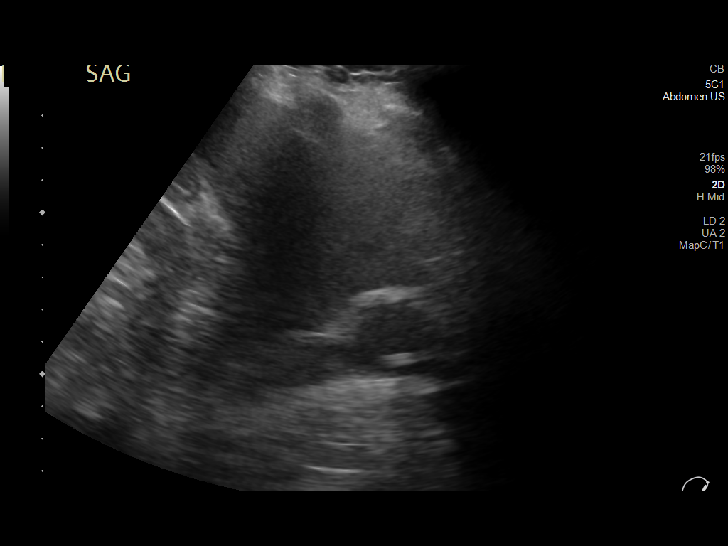
[im 21/46]
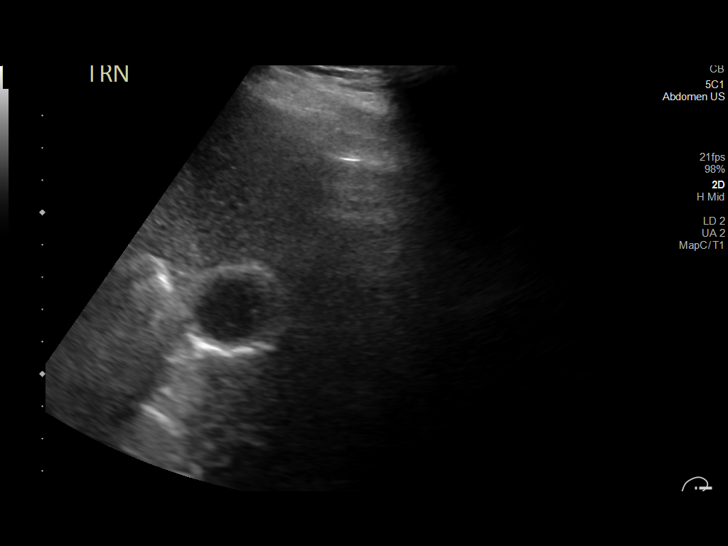
[im 25/46]
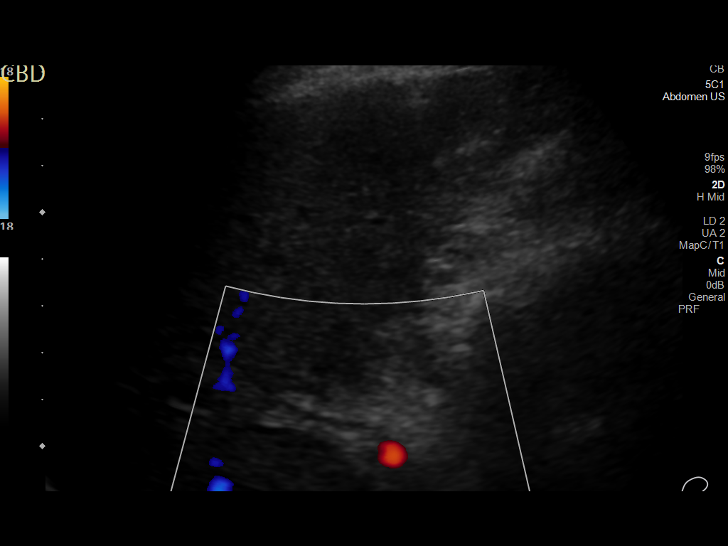
[im 29/46]
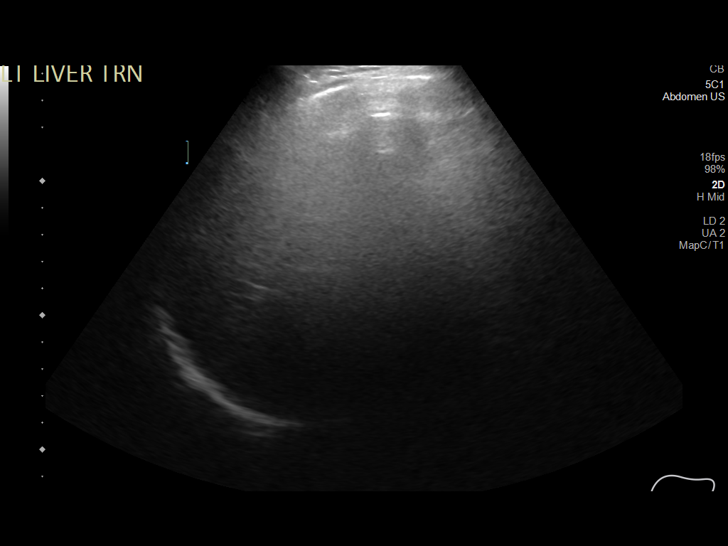
[im 31/46]
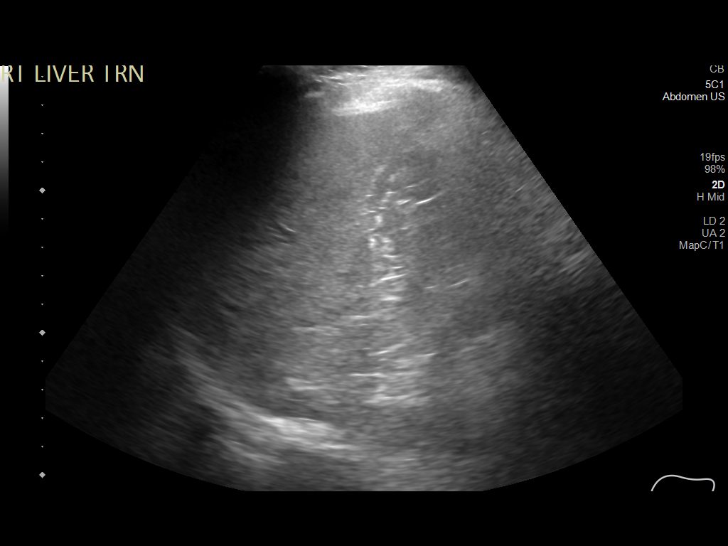
[im 34/46]
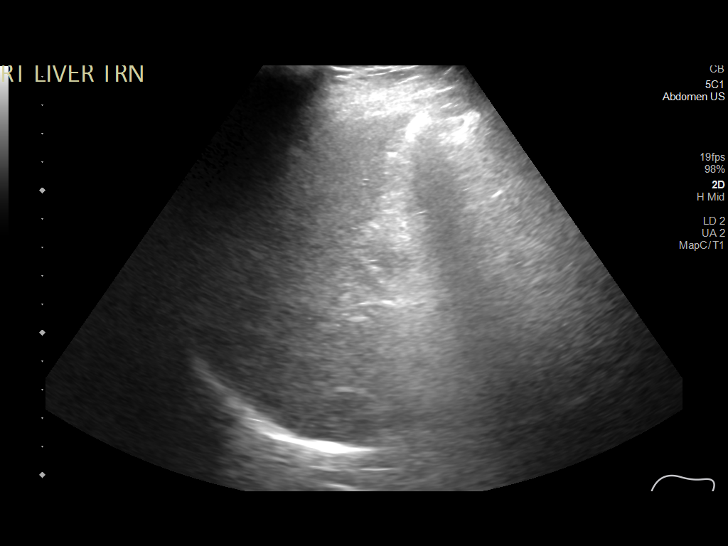
[im 38/46]
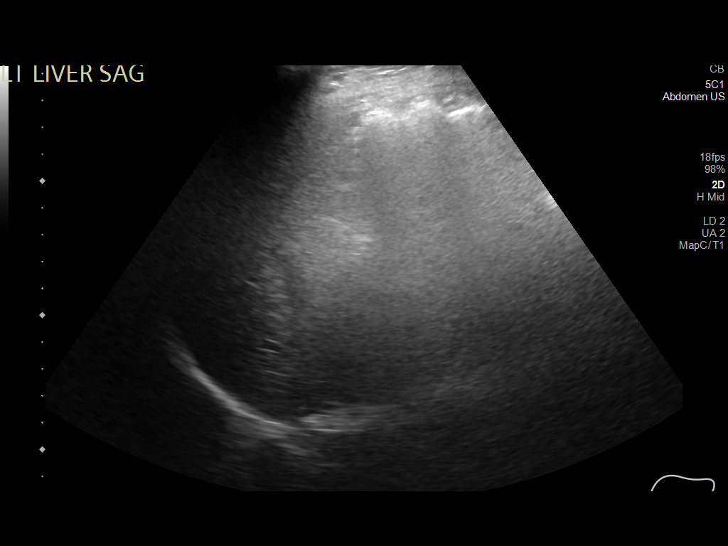
[im 42/46]
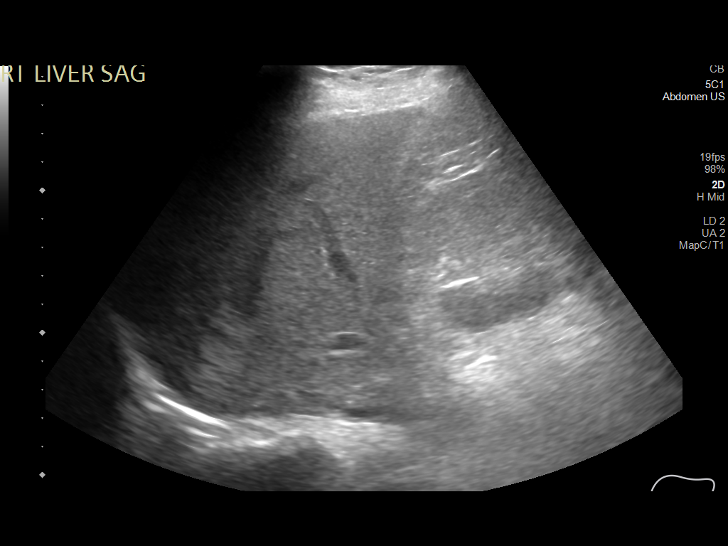
[im 46/46]
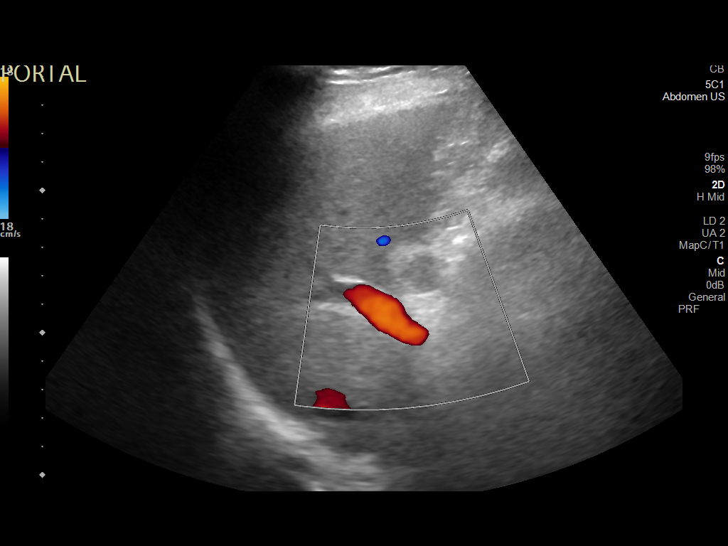

[14 of 25 positions shown; findings below may reference images not displayed]

FINDINGS: Gallbladder:

Gallstones are identified which measure up to 1.6 cm. Gallbladder
wall appears mildly thickened measuring 3.5 mm. No pericholecystic
fluid. Negative sonographic Murphy's sign.

Common bile duct:

Diameter: 2.5 mm

Liver:

No focal lesion identified. Within normal limits in parenchymal
echogenicity. Portal vein is patent on color Doppler imaging with
normal direction of blood flow towards the liver.

Other: Exam detail is diminished due to poor sonographic acoustic
windows and increased bowel gas.
IMPRESSION: 1. Diminished exam detail due to increased bowel gas.
2. Gallstones and mild gallbladder wall thickening.

## 2023-12-13 ENCOUNTER — Inpatient Hospital Stay (HOSPITAL_COMMUNITY)

## 2023-12-13 ENCOUNTER — Emergency Department (HOSPITAL_COMMUNITY)

## 2023-12-13 ENCOUNTER — Inpatient Hospital Stay (HOSPITAL_COMMUNITY)
Admission: EM | Admit: 2023-12-13 | Discharge: 2023-12-16 | DRG: 208 | Disposition: E | Attending: Internal Medicine | Admitting: Internal Medicine

## 2023-12-13 ENCOUNTER — Other Ambulatory Visit: Payer: Self-pay

## 2023-12-13 ENCOUNTER — Encounter (HOSPITAL_COMMUNITY): Payer: Self-pay

## 2023-12-13 DIAGNOSIS — E43 Unspecified severe protein-calorie malnutrition: Secondary | ICD-10-CM | POA: Diagnosis present

## 2023-12-13 DIAGNOSIS — Z1152 Encounter for screening for COVID-19: Secondary | ICD-10-CM

## 2023-12-13 DIAGNOSIS — R627 Adult failure to thrive: Secondary | ICD-10-CM | POA: Diagnosis present

## 2023-12-13 DIAGNOSIS — E876 Hypokalemia: Secondary | ICD-10-CM | POA: Diagnosis present

## 2023-12-13 DIAGNOSIS — J690 Pneumonitis due to inhalation of food and vomit: Secondary | ICD-10-CM | POA: Diagnosis not present

## 2023-12-13 DIAGNOSIS — T17590A Other foreign object in bronchus causing asphyxiation, initial encounter: Secondary | ICD-10-CM | POA: Diagnosis not present

## 2023-12-13 DIAGNOSIS — A419 Sepsis, unspecified organism: Secondary | ICD-10-CM | POA: Diagnosis not present

## 2023-12-13 DIAGNOSIS — X58XXXA Exposure to other specified factors, initial encounter: Secondary | ICD-10-CM | POA: Diagnosis not present

## 2023-12-13 DIAGNOSIS — Z515 Encounter for palliative care: Secondary | ICD-10-CM | POA: Diagnosis not present

## 2023-12-13 DIAGNOSIS — G7109 Other specified muscular dystrophies: Secondary | ICD-10-CM | POA: Diagnosis present

## 2023-12-13 DIAGNOSIS — S2231XA Fracture of one rib, right side, initial encounter for closed fracture: Secondary | ICD-10-CM

## 2023-12-13 DIAGNOSIS — I5181 Takotsubo syndrome: Secondary | ICD-10-CM | POA: Diagnosis present

## 2023-12-13 DIAGNOSIS — I472 Ventricular tachycardia, unspecified: Secondary | ICD-10-CM | POA: Diagnosis present

## 2023-12-13 DIAGNOSIS — I5021 Acute systolic (congestive) heart failure: Secondary | ICD-10-CM | POA: Diagnosis present

## 2023-12-13 DIAGNOSIS — I468 Cardiac arrest due to other underlying condition: Secondary | ICD-10-CM | POA: Diagnosis present

## 2023-12-13 DIAGNOSIS — I469 Cardiac arrest, cause unspecified: Principal | ICD-10-CM | POA: Diagnosis present

## 2023-12-13 DIAGNOSIS — Z8042 Family history of malignant neoplasm of prostate: Secondary | ICD-10-CM

## 2023-12-13 DIAGNOSIS — K529 Noninfective gastroenteritis and colitis, unspecified: Secondary | ICD-10-CM | POA: Diagnosis present

## 2023-12-13 DIAGNOSIS — K449 Diaphragmatic hernia without obstruction or gangrene: Secondary | ICD-10-CM | POA: Diagnosis present

## 2023-12-13 DIAGNOSIS — G7100 Muscular dystrophy, unspecified: Secondary | ICD-10-CM | POA: Diagnosis not present

## 2023-12-13 DIAGNOSIS — S270XXA Traumatic pneumothorax, initial encounter: Secondary | ICD-10-CM | POA: Diagnosis not present

## 2023-12-13 DIAGNOSIS — G7111 Myotonic muscular dystrophy: Secondary | ICD-10-CM | POA: Diagnosis present

## 2023-12-13 DIAGNOSIS — E8729 Other acidosis: Secondary | ICD-10-CM | POA: Diagnosis present

## 2023-12-13 DIAGNOSIS — J9601 Acute respiratory failure with hypoxia: Secondary | ICD-10-CM | POA: Diagnosis present

## 2023-12-13 DIAGNOSIS — I447 Left bundle-branch block, unspecified: Secondary | ICD-10-CM | POA: Diagnosis present

## 2023-12-13 DIAGNOSIS — Z803 Family history of malignant neoplasm of breast: Secondary | ICD-10-CM

## 2023-12-13 DIAGNOSIS — R570 Cardiogenic shock: Secondary | ICD-10-CM | POA: Diagnosis not present

## 2023-12-13 DIAGNOSIS — R579 Shock, unspecified: Secondary | ICD-10-CM | POA: Diagnosis not present

## 2023-12-13 DIAGNOSIS — J9602 Acute respiratory failure with hypercapnia: Secondary | ICD-10-CM | POA: Diagnosis present

## 2023-12-13 DIAGNOSIS — M96A3 Multiple fractures of ribs associated with chest compression and cardiopulmonary resuscitation: Secondary | ICD-10-CM | POA: Diagnosis present

## 2023-12-13 DIAGNOSIS — R6521 Severe sepsis with septic shock: Secondary | ICD-10-CM | POA: Diagnosis not present

## 2023-12-13 DIAGNOSIS — Z66 Do not resuscitate: Secondary | ICD-10-CM | POA: Diagnosis present

## 2023-12-13 DIAGNOSIS — T17908A Unspecified foreign body in respiratory tract, part unspecified causing other injury, initial encounter: Secondary | ICD-10-CM

## 2023-12-13 DIAGNOSIS — L299 Pruritus, unspecified: Secondary | ICD-10-CM | POA: Diagnosis not present

## 2023-12-13 DIAGNOSIS — F17210 Nicotine dependence, cigarettes, uncomplicated: Secondary | ICD-10-CM | POA: Diagnosis present

## 2023-12-13 DIAGNOSIS — R64 Cachexia: Secondary | ICD-10-CM | POA: Diagnosis present

## 2023-12-13 DIAGNOSIS — K8020 Calculus of gallbladder without cholecystitis without obstruction: Secondary | ICD-10-CM | POA: Diagnosis present

## 2023-12-13 DIAGNOSIS — I5020 Unspecified systolic (congestive) heart failure: Secondary | ICD-10-CM | POA: Diagnosis not present

## 2023-12-13 DIAGNOSIS — K7200 Acute and subacute hepatic failure without coma: Secondary | ICD-10-CM | POA: Diagnosis present

## 2023-12-13 LAB — URINALYSIS, ROUTINE W REFLEX MICROSCOPIC
Bacteria, UA: NONE SEEN
Bilirubin Urine: NEGATIVE
Glucose, UA: 50 mg/dL — AB
Ketones, ur: 5 mg/dL — AB
Leukocytes,Ua: NEGATIVE
Nitrite: NEGATIVE
Protein, ur: 30 mg/dL — AB
Specific Gravity, Urine: 1.025 (ref 1.005–1.030)
pH: 5 (ref 5.0–8.0)

## 2023-12-13 LAB — RESP PANEL BY RT-PCR (RSV, FLU A&B, COVID)  RVPGX2
Influenza A by PCR: NEGATIVE
Influenza B by PCR: NEGATIVE
Resp Syncytial Virus by PCR: NEGATIVE
SARS Coronavirus 2 by RT PCR: NEGATIVE

## 2023-12-13 LAB — I-STAT ARTERIAL BLOOD GAS, ED
Acid-base deficit: 2 mmol/L (ref 0.0–2.0)
Bicarbonate: 26.3 mmol/L (ref 20.0–28.0)
Calcium, Ion: 1.25 mmol/L (ref 1.15–1.40)
HCT: 45 % (ref 39.0–52.0)
Hemoglobin: 15.3 g/dL (ref 13.0–17.0)
O2 Saturation: 94 %
Patient temperature: 99
Potassium: 3.3 mmol/L — ABNORMAL LOW (ref 3.5–5.1)
Sodium: 144 mmol/L (ref 135–145)
TCO2: 28 mmol/L (ref 22–32)
pCO2 arterial: 60.9 mmHg — ABNORMAL HIGH (ref 32–48)
pH, Arterial: 7.244 — ABNORMAL LOW (ref 7.35–7.45)
pO2, Arterial: 86 mmHg (ref 83–108)

## 2023-12-13 LAB — POCT I-STAT 7, (LYTES, BLD GAS, ICA,H+H)
Acid-base deficit: 7 mmol/L — ABNORMAL HIGH (ref 0.0–2.0)
Bicarbonate: 20.9 mmol/L (ref 20.0–28.0)
Calcium, Ion: 1.09 mmol/L — ABNORMAL LOW (ref 1.15–1.40)
HCT: 43 % (ref 39.0–52.0)
Hemoglobin: 14.6 g/dL (ref 13.0–17.0)
O2 Saturation: 89 %
Patient temperature: 99
Potassium: 2.5 mmol/L — CL (ref 3.5–5.1)
Sodium: 141 mmol/L (ref 135–145)
TCO2: 22 mmol/L (ref 22–32)
pCO2 arterial: 48.9 mmHg — ABNORMAL HIGH (ref 32–48)
pH, Arterial: 7.24 — ABNORMAL LOW (ref 7.35–7.45)
pO2, Arterial: 67 mmHg — ABNORMAL LOW (ref 83–108)

## 2023-12-13 LAB — ECHOCARDIOGRAM COMPLETE
Est EF: 25
Height: 68 in
S' Lateral: 3.8 cm
Weight: 2645.52 [oz_av]

## 2023-12-13 LAB — COMPREHENSIVE METABOLIC PANEL WITH GFR
ALT: 54 U/L — ABNORMAL HIGH (ref 0–44)
ALT: 169 U/L — ABNORMAL HIGH (ref 0–44)
AST: 67 U/L — ABNORMAL HIGH (ref 15–41)
AST: 203 U/L — ABNORMAL HIGH (ref 15–41)
Albumin: 3.7 g/dL (ref 3.5–5.0)
Albumin: 2.7 g/dL — ABNORMAL LOW (ref 3.5–5.0)
Alkaline Phosphatase: 146 U/L — ABNORMAL HIGH (ref 38–126)
Alkaline Phosphatase: 123 U/L (ref 38–126)
Anion gap: 13 (ref 5–15)
Anion gap: 10 (ref 5–15)
BUN: 16 mg/dL (ref 6–20)
BUN: 15 mg/dL (ref 6–20)
CO2: 27 mmol/L (ref 22–32)
CO2: 20 mmol/L — ABNORMAL LOW (ref 22–32)
Calcium: 9 mg/dL (ref 8.9–10.3)
Calcium: 7.8 mg/dL — ABNORMAL LOW (ref 8.9–10.3)
Chloride: 103 mmol/L (ref 98–111)
Chloride: 108 mmol/L (ref 98–111)
Creatinine, Ser: 1.03 mg/dL (ref 0.61–1.24)
Creatinine, Ser: 0.92 mg/dL (ref 0.61–1.24)
GFR, Estimated: >60 mL/min (ref >60)
GFR, Estimated: >60 mL/min (ref >60)
Glucose, Bld: 139 mg/dL — ABNORMAL HIGH (ref 70–99)
Glucose, Bld: 234 mg/dL — ABNORMAL HIGH (ref 70–99)
Potassium: 3 mmol/L — ABNORMAL LOW (ref 3.5–5.1)
Potassium: 3.2 mmol/L — ABNORMAL LOW (ref 3.5–5.1)
Sodium: 143 mmol/L (ref 135–145)
Sodium: 138 mmol/L (ref 135–145)
Total Bilirubin: 2.4 mg/dL — ABNORMAL HIGH (ref 0.0–1.2)
Total Bilirubin: 2.7 mg/dL — ABNORMAL HIGH (ref 0.0–1.2)
Total Protein: 7.3 g/dL (ref 6.5–8.1)
Total Protein: 5.6 g/dL — ABNORMAL LOW (ref 6.5–8.1)

## 2023-12-13 LAB — COOXEMETRY PANEL
Carboxyhemoglobin: 0.8 % (ref 0.5–1.5)
Methemoglobin: <0.7 % (ref 0.0–1.5)
O2 Saturation: 56.2 %
Total hemoglobin: 15.4 g/dL (ref 12.0–16.0)

## 2023-12-13 LAB — CBC WITH DIFFERENTIAL/PLATELET
Abs Immature Granulocytes: 0.07 10*3/uL (ref 0.00–0.07)
Basophils Absolute: 0 10*3/uL (ref 0.0–0.1)
Basophils Relative: 0 %
Eosinophils Absolute: 0 10*3/uL (ref 0.0–0.5)
Eosinophils Relative: 0 %
HCT: 49.5 % (ref 39.0–52.0)
Hemoglobin: 16.4 g/dL (ref 13.0–17.0)
Immature Granulocytes: 1 %
Lymphocytes Relative: 6 %
Lymphs Abs: 0.8 10*3/uL (ref 0.7–4.0)
MCH: 28.3 pg (ref 26.0–34.0)
MCHC: 33.1 g/dL (ref 30.0–36.0)
MCV: 85.5 fL (ref 80.0–100.0)
Monocytes Absolute: 0.9 10*3/uL (ref 0.1–1.0)
Monocytes Relative: 6 %
Neutro Abs: 13 10*3/uL — ABNORMAL HIGH (ref 1.7–7.7)
Neutrophils Relative %: 87 %
Platelets: 215 10*3/uL (ref 150–400)
RBC: 5.79 MIL/uL (ref 4.22–5.81)
RDW: 12.5 % (ref 11.5–15.5)
WBC: 14.8 10*3/uL — ABNORMAL HIGH (ref 4.0–10.5)
nRBC: 0 % (ref 0.0–0.2)

## 2023-12-13 LAB — GLUCOSE, CAPILLARY: Glucose-Capillary: 156 mg/dL — ABNORMAL HIGH (ref 70–99)

## 2023-12-13 LAB — HIV ANTIBODY (ROUTINE TESTING W REFLEX): HIV Screen 4th Generation wRfx: NONREACTIVE

## 2023-12-13 LAB — POC OCCULT BLOOD, ED: Fecal Occult Bld: NEGATIVE

## 2023-12-13 LAB — I-STAT CG4 LACTIC ACID, ED: Lactic Acid, Venous: 3.3 mmol/L (ref 0.5–1.9)

## 2023-12-13 LAB — MAGNESIUM: Magnesium: 2.3 mg/dL (ref 1.7–2.4)

## 2023-12-13 LAB — TROPONIN I (HIGH SENSITIVITY): Troponin I (High Sensitivity): 144 ng/L (ref <18)

## 2023-12-13 LAB — CBG MONITORING, ED: Glucose-Capillary: 140 mg/dL — ABNORMAL HIGH (ref 70–99)

## 2023-12-13 LAB — MRSA NEXT GEN BY PCR, NASAL: MRSA by PCR Next Gen: NOT DETECTED

## 2023-12-13 LAB — LIPASE, BLOOD: Lipase: 19 U/L (ref 11–51)

## 2023-12-13 MED ORDER — NOREPINEPHRINE 4 MG/250ML-% IV SOLN
0.0000 ug/min | INTRAVENOUS | Status: DC
  Administered 2023-12-13: 10 ug/min via INTRAVENOUS
  Administered 2023-12-13: 23 ug/min via INTRAVENOUS
  Administered 2023-12-13: 31 ug/min via INTRAVENOUS
  Administered 2023-12-13: 8 ug/min via INTRAVENOUS
  Filled 2023-12-13 (×3): qty 250

## 2023-12-13 MED ORDER — ROCURONIUM BROMIDE 10 MG/ML (PF) SYRINGE
PREFILLED_SYRINGE | INTRAVENOUS | Status: AC | PRN
  Administered 2023-12-13: 100 mg via INTRAVENOUS

## 2023-12-13 MED ORDER — FAMOTIDINE 20 MG PO TABS: 20.0000 mg | ORAL_TABLET | Freq: Two times a day (BID) | ORAL | Status: DC

## 2023-12-13 MED ORDER — SODIUM CHLORIDE 0.9 % IV SOLN
1.0000 g | Freq: Once | INTRAVENOUS | Status: AC
  Administered 2023-12-13: 1 g via INTRAVENOUS
  Filled 2023-12-13: qty 10

## 2023-12-13 MED ORDER — PERFLUTREN LIPID MICROSPHERE
1.0000 mL | INTRAVENOUS | Status: AC | PRN
  Administered 2023-12-13: 4 mL via INTRAVENOUS

## 2023-12-13 MED ORDER — VANCOMYCIN HCL 1750 MG/350ML IV SOLN
1750.0000 mg | INTRAVENOUS | Status: DC
  Filled 2023-12-13: qty 350

## 2023-12-13 MED ORDER — SODIUM CHLORIDE 0.9% FLUSH: 10.0000 mL | INTRAVENOUS | Status: DC | PRN

## 2023-12-13 MED ORDER — NOREPINEPHRINE 16 MG/250ML-% IV SOLN
0.0000 ug/min | INTRAVENOUS | Status: DC
  Administered 2023-12-13: 40 ug/min via INTRAVENOUS
  Administered 2023-12-14: 35 ug/min via INTRAVENOUS
  Administered 2023-12-14: 10 ug/min via INTRAVENOUS
  Filled 2023-12-13 (×3): qty 250

## 2023-12-13 MED ORDER — AMIODARONE HCL IN DEXTROSE 360-4.14 MG/200ML-% IV SOLN
60.0000 mg/h | INTRAVENOUS | Status: AC
  Administered 2023-12-13 (×2): 60 mg/h via INTRAVENOUS
  Filled 2023-12-13: qty 200

## 2023-12-13 MED ORDER — POTASSIUM CHLORIDE 10 MEQ/100ML IV SOLN
10.0000 meq | INTRAVENOUS | Status: DC
  Administered 2023-12-13: 10 meq via INTRAVENOUS
  Filled 2023-12-13: qty 100

## 2023-12-13 MED ORDER — POLYETHYLENE GLYCOL 3350 17 G PO PACK: 17.0000 g | PACK | Freq: Every day | ORAL | Status: DC | PRN

## 2023-12-13 MED ORDER — ORAL CARE MOUTH RINSE
15.0000 mL | OROMUCOSAL | Status: DC
  Administered 2023-12-13 – 2023-12-15 (×18): 15 mL via OROMUCOSAL

## 2023-12-13 MED ORDER — ORAL CARE MOUTH RINSE: 15.0000 mL | OROMUCOSAL | Status: DC | PRN

## 2023-12-13 MED ORDER — ONDANSETRON HCL 4 MG/2ML IJ SOLN
4.0000 mg | Freq: Once | INTRAMUSCULAR | Status: AC
  Administered 2023-12-13: 4 mg via INTRAVENOUS
  Filled 2023-12-13: qty 2

## 2023-12-13 MED ORDER — FENTANYL 2500MCG IN NS 250ML (10MCG/ML) PREMIX INFUSION
50.0000 ug/h | INTRAVENOUS | Status: DC
  Administered 2023-12-13 (×2): 50 ug/h via INTRAVENOUS
  Filled 2023-12-13: qty 250

## 2023-12-13 MED ORDER — AMIODARONE IV BOLUS ONLY 150 MG/100ML: INTRAVENOUS | Status: AC | PRN

## 2023-12-13 MED ORDER — AMIODARONE HCL IN DEXTROSE 360-4.14 MG/200ML-% IV SOLN
30.0000 mg/h | INTRAVENOUS | Status: DC
  Administered 2023-12-13 – 2023-12-15 (×4): 30 mg/h via INTRAVENOUS
  Filled 2023-12-13 (×4): qty 200

## 2023-12-13 MED ORDER — FENTANYL BOLUS VIA INFUSION
50.0000 ug | INTRAVENOUS | Status: DC | PRN
Start: 2023-12-13 — End: 2023-12-15
  Administered 2023-12-13 – 2023-12-14 (×5): 50 ug via INTRAVENOUS

## 2023-12-13 MED ORDER — AMIODARONE LOAD VIA INFUSION
150.0000 mg | Freq: Once | INTRAVENOUS | Status: AC
  Administered 2023-12-13: 150 mg via INTRAVENOUS
  Filled 2023-12-13: qty 83.34

## 2023-12-13 MED ORDER — DOCUSATE SODIUM 50 MG/5ML PO LIQD: 100.0000 mg | Freq: Two times a day (BID) | ORAL | Status: DC | PRN

## 2023-12-13 MED ORDER — EPINEPHRINE 1 MG/10ML IJ SOSY
PREFILLED_SYRINGE | INTRAMUSCULAR | Status: AC | PRN
  Administered 2023-12-13: 1 mg via INTRAVENOUS

## 2023-12-13 MED ORDER — ALBUTEROL SULFATE (2.5 MG/3ML) 0.083% IN NEBU: 2.5000 mg | INHALATION_SOLUTION | RESPIRATORY_TRACT | Status: DC | PRN

## 2023-12-13 MED ORDER — PIPERACILLIN-TAZOBACTAM 3.375 G IVPB
3.3750 g | Freq: Three times a day (TID) | INTRAVENOUS | Status: DC
  Administered 2023-12-13 – 2023-12-15 (×7): 3.375 g via INTRAVENOUS
  Filled 2023-12-13 (×7): qty 50

## 2023-12-13 MED ORDER — SODIUM CHLORIDE 0.9 % IV BOLUS
1000.0000 mL | Freq: Once | INTRAVENOUS | Status: AC
  Administered 2023-12-13: 1000 mL via INTRAVENOUS

## 2023-12-13 MED ORDER — IOHEXOL 350 MG/ML SOLN
75.0000 mL | Freq: Once | INTRAVENOUS | Status: AC | PRN
Start: 2023-12-13 — End: 2023-12-13
  Administered 2023-12-13: 75 mL via INTRAVENOUS

## 2023-12-13 MED ORDER — PROPOFOL 1000 MG/100ML IV EMUL
0.0000 ug/kg/min | INTRAVENOUS | Status: DC
  Administered 2023-12-13: 5 ug/kg/min via INTRAVENOUS

## 2023-12-13 MED ORDER — SODIUM CHLORIDE 0.9 % IV SOLN
500.0000 mg | Freq: Once | INTRAVENOUS | Status: DC
  Filled 2023-12-13: qty 5

## 2023-12-13 MED ORDER — AZITHROMYCIN 500 MG PO TABS: 500.0000 mg | ORAL_TABLET | Freq: Every day | ORAL | Status: DC

## 2023-12-13 MED ORDER — CHLORHEXIDINE GLUCONATE CLOTH 2 % EX PADS
6.0000 | MEDICATED_PAD | Freq: Every day | CUTANEOUS | Status: DC
Start: 2023-12-13 — End: 2023-12-16
  Administered 2023-12-13 – 2023-12-15 (×3): 6 via TOPICAL

## 2023-12-13 MED ORDER — VANCOMYCIN HCL 2000 MG/400ML IV SOLN
2000.0000 mg | Freq: Once | INTRAVENOUS | Status: AC
  Administered 2023-12-13: 2000 mg via INTRAVENOUS
  Filled 2023-12-13: qty 400

## 2023-12-13 MED ORDER — POTASSIUM CHLORIDE 20 MEQ PO PACK
60.0000 meq | PACK | Freq: Once | ORAL | Status: DC
  Filled 2023-12-13: qty 3

## 2023-12-13 MED ORDER — MAGNESIUM SULFATE 2 GM/50ML IV SOLN
2.0000 g | Freq: Once | INTRAVENOUS | Status: AC
  Administered 2023-12-13: 2 g via INTRAVENOUS

## 2023-12-13 MED ORDER — ETOMIDATE 2 MG/ML IV SOLN
INTRAVENOUS | Status: AC | PRN
  Administered 2023-12-13: 10 mg via INTRAVENOUS

## 2023-12-13 MED ORDER — POTASSIUM CHLORIDE 10 MEQ/50ML IV SOLN
10.0000 meq | INTRAVENOUS | Status: AC
  Administered 2023-12-13 (×2): 10 meq via INTRAVENOUS
  Filled 2023-12-13: qty 50

## 2023-12-13 MED ORDER — IOHEXOL 12 MG/ML PO SOLN: 200.0000 mL | ORAL | Status: AC

## 2023-12-13 MED ORDER — SODIUM CHLORIDE 0.9% FLUSH
10.0000 mL | Freq: Two times a day (BID) | INTRAVENOUS | Status: DC
  Administered 2023-12-13 – 2023-12-15 (×3): 10 mL

## 2023-12-13 MED ORDER — VASOPRESSIN 20 UNITS/100 ML INFUSION FOR SHOCK
0.0000 [IU]/min | INTRAVENOUS | Status: DC
  Administered 2023-12-13 – 2023-12-14 (×2): 0.03 [IU]/min via INTRAVENOUS
  Administered 2023-12-14: 0.02 [IU]/min via INTRAVENOUS
  Filled 2023-12-13 (×3): qty 100

## 2023-12-13 MED ORDER — SODIUM CHLORIDE 0.9 % IV SOLN: 250.0000 mL | INTRAVENOUS | Status: AC

## 2023-12-13 MED ORDER — SODIUM CHLORIDE 0.9 % IV SOLN
100.0000 mg | Freq: Two times a day (BID) | INTRAVENOUS | Status: DC
  Administered 2023-12-13 – 2023-12-15 (×5): 100 mg via INTRAVENOUS
  Filled 2023-12-13 (×6): qty 100

## 2023-12-13 MED ORDER — POTASSIUM CHLORIDE 10 MEQ/50ML IV SOLN
10.0000 meq | INTRAVENOUS | Status: AC
  Administered 2023-12-13 – 2023-12-14 (×6): 10 meq via INTRAVENOUS
  Filled 2023-12-13 (×5): qty 50

## 2023-12-13 MED ORDER — AZITHROMYCIN 500 MG PO TABS
500.0000 mg | ORAL_TABLET | Freq: Every day | ORAL | Status: DC
  Filled 2023-12-13: qty 1

## 2023-12-13 NOTE — Procedures (Signed)
 Arterial Line Insertion Start/End3/29/2025 11:50 AM, 12/13/2023 12:11 PM  Patient location: ICU. Preanesthetic checklist: patient identified, site marked, monitors and equipment checked and timeout performed Right, radial was placed Catheter size: 20 G Hand hygiene performed  and maximum sterile barriers used  Allen's test indicative of satisfactory collateral circulation Attempts: 1 Procedure performed without using ultrasound guided technique. Ultrasound Notes:anatomy identified Following insertion, dressing applied and Biopatch. Post procedure assessment: normal and unchanged  Patient tolerated the procedure well with no immediate complications. Additional procedure comments: RT x2 at bedside placed Aline. Marland Kitchen

## 2023-12-13 NOTE — Progress Notes (Signed)
 RT pulled back ETT 2cm per verbal order given to RT by ED MD. Pts ETT secured at 25 @ lip.

## 2023-12-13 NOTE — ED Notes (Signed)
Intubation complete.

## 2023-12-13 NOTE — Progress Notes (Signed)
 RT assisted with transport of this pt from ED room 17 to CT and back while on full ventilatory support. Pt tolerated well with no complications.

## 2023-12-13 NOTE — Progress Notes (Signed)
 RT assisted with transport of this pt from ED room 17 to 2H07 while on full ventilatory support. Pt tolerated well with no complications.

## 2023-12-13 NOTE — Progress Notes (Signed)
 PHARMACY ANTIBIOTIC CONSULT NOTE   Mitchell Maldonado a 58 y.o. male presented to ED 3/29 with weakness/SOB, PEA arrest in ED.  Pharmacy has been consulted for vancomycin/zosyn dosing for possible aspiration.  Received CRO 1g x1 in the ED MRSA PCR pending WBC 14.8, SCr 1.03, LA 3.3  Estimated Creatinine Clearance: 78.4 mL/min (by C-G formula based on SCr of 1.03 mg/dL).  Plan: START Zosyn 3.375 g IV Q8H (EI)  GIVE Vancomycin 2000 mg IV x1 THEN Vancomycin 1750 mg IV Q24H (Scr used: 1.03, Vd used: 0.72, eAUC: 466.5) Azithromycin per primary   Monitor renal function, clinical status, de-escalation, C/S, levels as indicated   Allergies:  Allergies  Allergen Reactions   Ham Flavoring Agent (Non-Screening) Rash    Filed Weights   12/13/23 1030  Weight: 75 kg (165 lb 5.5 oz)       Latest Ref Rng & Units 12/13/2023   12:21 PM 12/13/2023    8:40 AM 12/13/2023    8:31 AM  CBC  WBC 4.0 - 10.5 K/uL  14.8    Hemoglobin 13.0 - 17.0 g/dL 27.2  53.6  64.4   Hematocrit 39.0 - 52.0 % 43.0  49.5  45.0   Platelets 150 - 400 K/uL  215      Antibiotics Given (last 72 hours)     Date/Time Action Medication Dose Rate   12/13/23 0934 New Bag/Given   cefTRIAXone (ROCEPHIN) 1 g in sodium chloride 0.9 % 100 mL IVPB 1 g 200 mL/hr       Antimicrobials this admission: CRO 3/29 x1 Azithromycin 3/29 > c Vancomycin 3/29 > c Zosyn 3/29 > c  Microbiology results: 3/29 Bcx: sent 3/29 trach aspirate: sent  3/29 MRSA PCR: sent   Thank you for allowing pharmacy to be a part of this patient's care.  Trixie Rude, PharmD Clinical Pharmacist 12/13/2023  1:05 PM

## 2023-12-13 NOTE — Procedures (Signed)
 Insertion of Chest Tube Procedure Note  BELL CAI  [id number]  Oct 22, 1965  Date:12/13/23  Time:1:51 PM    Provider Performing: Leslye Peer   Procedure: Pleural Catheter Insertion w/o Imaging Guidance (40981)  Indication(s) Loculated pleural air, likely traumatic Pneumothorax from CPR  Consent Risks of the procedure as well as the alternatives and risks of each were explained to the patient and/or caregiver.  Consent for the procedure was obtained and is signed in the bedside chart  Anesthesia Topical only with 1% lidocaine    Time Out Verified patient identification, verified procedure, site/side was marked, verified correct patient position, special equipment/implants available, medications/allergies/relevant history reviewed, required imaging and test results available.   Sterile Technique Maximal sterile technique including full sterile barrier drape, hand hygiene, sterile gown, sterile gloves, mask, hair covering, sterile ultrasound probe cover (if used).   Procedure Description Ultrasound not used to identify appropriate pleural anatomy for placement and overlying skin marked. Area of placement cleaned and draped in sterile fashion.  A 14 French pigtail pleural catheter was placed into the right pleural space using Seldinger technique. Appropriate return of air was obtained.  The tube was connected to atrium and placed on -20 cm H2O wall suction.   Complications/Tolerance None; patient tolerated the procedure well. Chest X-ray is ordered to verify placement.   EBL Minimal  Specimen(s) none   Levy Pupa, MD, PhD 12/13/2023, 1:52 PM [facility] [phone] or if no answer before 7:00PM call [phone] For any issues after 7:00PM please call eLink [phone]

## 2023-12-13 NOTE — ED Triage Notes (Signed)
 Pt BIB [facility] from home d/t generalized weakness and SOB. Pt stated he has been feeling nauseous and  vomited for the past three days. Upon EMS arrival, pt's O2 was in the 80s, placed on 4L by EMS. A&O X4.   Cbg 207 Bp 160/80 HR 90-100

## 2023-12-13 NOTE — H&P (Signed)
 NAME:  Mitchell Maldonado, MRN:  [id number], DOB:  02-Mar-1966, LOS: 0 ADMISSION DATE:  12/13/2023, CONSULTATION DATE: 12/13/2023 REFERRING MD: Dr. Elayne Snare, CHIEF COMPLAINT: Cardiopulmonary arrest  History of Present Illness:  58 year old man, history of tobacco use, myotonic dystrophy (confirmed by EMG).  He presented to the ED on 3/29 with 3 to 4 days of ongoing nausea vomiting and progressive weakness.  He called EMS on 3/29 and was noted to be hypoxemic, SpO2 80's% on RA.  Initially improved with nasal cannula.  He did deny fevers, chills, abdominal pain.  He had minimal p.o. intake for 4 days.  Influenza, COVID-19 and RSV all negative Required escalation of his FiO2, ABG with respiratory acidosis and hypoxemia.  Other labs significant for hypokalemia and mild transaminitis.  He is involved progressive respiratory failure, ultimately pulselessness.  CPR initiated, initially PEA and then wide-complex tachycardia from which he was cardioverted.  Amiodarone bolus and infusion initiated.  Also received IV magnesium.  Endotracheally intubated and stability achieved.  Postintubation chest x-ray with bilateral patchy airspace opacities particularly on the left, less prominent in the right midlung consistent with pneumonia.   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  No mediastinal or hilar adenopathy.  Apparent mucus or debris in the left mainstem bronchus and multiple left lower lobe bronchi with left lower lobe consolidation.  Extensive centrilobular groundglass nodularity in the left upper lobe, right midlung.  2 local areas of loculated air in the right midlung with an acute  nondisplaced right lateral fourth rib fracture. CT abdomen/pelvis 3/29 >> stable cholelithiasis, otherwise normal  Interim History / Subjective:  Moved to the ICU on mechanical ventilation, norepinephrine 8, amiodarone infusion, low-dose propofol   Objective   Blood pressure (!) 170/70, pulse (!) 125, temperature 99 F (37.2 C), temperature source Rectal, resp. rate 18, height 5\' 8"  (1.727 m), weight 75 kg, SpO2 100%.    Vent Mode: PRVC FiO2 (%):  [100 %] 100 % Set Rate:  [18 bmp] 18 bmp Vt Set:  [550 mL] 550 mL PEEP:  [10 cmH20] 10 cmH20 Plateau Pressure:  [28 cmH20] 28 cmH20  No intake or output data in the 24 hours ending 12/13/23 1045 Filed Weights   12/13/23 1030  Weight: 75 kg    Examination: General: Acute and chronically ill-appearing man laying in bed, sedated HENT: ET tube in position, no secretions Lungs: Coarse bilaterally with inspiratory crackles, no wheezes Cardiovascular: Regular, no murmur Abdomen: Nondistended with positive bowel sounds Extremities: Cool, no edema Neuro: Sedated, unresponsive to voice, stimulation. GU: Unremarkable  Resolved Hospital Problem list      Assessment & Plan:   Acute hypoxic and hypercapnic respiratory failure Pneumonia, L>R, presumed bacterial or viral CAP but possibly with superimposed aspiration pneumonia during arrest -Broaden antibiotics to cover potential aspiration, vancomycin and Zosyn -Obtain respiratory culture and tailor antibiotics accordingly -PRVC 8 cc/kg.  Low threshold to decrease VT to 6 cc/kg if his infiltrates progressed -Follow chest x-ray -Sedation as per PAD protocol -VAP prevention order set -Pulmonary hygiene -Albuterol available as needed  Cardiac arrest, PEA then VT, presumed due to progressive respiratory failure At risk hypoxic encephalopathy post-arrest -Continue amiodarone  for now -Follow telemetry -At risk hypoxemic injury due to arrest, head CT ordered and pending  Shock, septic plus  possible component cardiogenic postarrest -Follow ECG, troponin trend -Echocardiogram -Trend lactate -Wean norepinephrine as able -Low threshold CBC placement to allow CBP, Co. oximetry  Right fourth rib fracture, nondisplaced Loculated pleural air in the right lung, at risk progressive pneumothorax -Follow chest x-ray, low threshold to place right pigtail catheter if any evidence evolving pneumothorax  Nausea/vomiting, gastroenteritis preadmission -Reassuring CT scan of the abdomen 3/29  Myotonic dystrophy -Supportive care.  Certainly may impact his ability to tolerate work of breathing, wean from MV  At risk malnutrition -Pursue TF per dietary recommendations on 3/30  Best Practice (right click and "Reselect all SmartList Selections" daily)   Diet/type: NPO DVT prophylaxis: will start after Head CT results Pressure ulcer(s): N/A GI prophylaxis: H2B Lines: N/A Foley:  Yes, and it is still needed Code Status:  full code Last date of multidisciplinary goals of care discussion [GOC discussion pending, patient's sister and HCPOA notified of events by ED physician on 3/29]  Labs   CBC: Recent Labs  Lab 12/13/23 0831 12/13/23 0840  WBC  --  14.8*  NEUTROABS  --  13.0*  HGB 15.3 16.4  HCT 45.0 49.5  MCV  --  85.5  PLT  --  215    Basic Metabolic Panel: Recent Labs  Lab 12/13/23 0831 12/13/23 0840  NA 144 143  K 3.3* 3.0*  CL  --  103  CO2  --  27  GLUCOSE  --  139*  BUN  --  16  CREATININE  --  1.03  CALCIUM  --  9.0  MG  --  2.3   GFR: Estimated Creatinine Clearance: 78.4 mL/min (by C-G formula based on SCr of 1.03 mg/dL). Recent Labs  Lab 12/13/23 0840 12/13/23 0858  WBC 14.8*  --   LATICACIDVEN  --  3.3*    Liver Function Tests: Recent Labs  Lab 12/13/23 0840  AST 67*  ALT 54*  ALKPHOS 146*  BILITOT 2.4*  PROT 7.3  ALBUMIN 3.7   Recent Labs  Lab 12/13/23 0840  LIPASE 19   No results for input(s): "AMMONIA" in the last 168  hours.  ABG    Component Value Date/Time   PHART 7.244 (L) 12/13/2023 0831   PCO2ART 60.9 (H) 12/13/2023 0831   PO2ART 86 12/13/2023 0831   HCO3 26.3 12/13/2023 0831   TCO2 28 12/13/2023 0831   ACIDBASEDEF 2.0 12/13/2023 0831   O2SAT 94 12/13/2023 0831     Coagulation Profile: No results for input(s): "INR", "PROTIME" in the last 168 hours.  Cardiac Enzymes: No results for input(s): "CKTOTAL", "CKMB", "CKMBINDEX", "TROPONINI" in the last 168 hours.  HbA1C: No results found for: "HGBA1C"  CBG: Recent Labs  Lab 12/13/23 0813  GLUCAP 140*    Review of Systems:   Unable to obtain  Past Medical History:  He,  has a past medical history of Ataxic gait (04/14/2014) and Classic myotonic dystrophy type 1 (HCC) (12/13/2014).   Surgical History:   Past Surgical History:  Procedure Laterality Date   CATARACT EXTRACTION Bilateral      Social History:   reports that he has been smoking cigarettes. He has never used smokeless tobacco. He reports current alcohol use. He reports that he does not use drugs.   Family History:  His family history includes Breast cancer in his mother; Prostate cancer in his father.   Allergies Allergies  Allergen Reactions   Ham Flavoring Agent (Non-Screening) Rash     Home Medications  Prior to Admission medications   Not on File     Critical care time: 60 minutes     Levy Pupa, MD, PhD 12/13/2023, 12:27 PM [facility] [phone] or if no answer before 7:00PM call [phone] For any issues after 7:00PM please call eLink [phone]

## 2023-12-13 NOTE — Progress Notes (Signed)
Sputum obtained and sent to lab at this time

## 2023-12-13 NOTE — Code Documentation (Addendum)
 Pulses present and now Ventricular rhythm(v-tach) and shock at 200J

## 2023-12-13 NOTE — ED Provider Notes (Signed)
 [place] [facility] AT [facility] Provider Note   CSN: [id number] Arrival date & time: 12/13/23  4098     History  Chief Complaint  Patient presents with   Weakness   Nausea   Emesis    Mitchell Maldonado is a 58 y.o. male.  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  Patient has experienced 3 to 4 days of ongoing nausea vomiting and generalized weakness at home.  EMS was called this morning given increased weakness.  They noted him to be hypoxic in the 80s on room air with some improvement after nasal cannula O2 was applied.  Patient did not tolerate O2 via nonrebreather.  In the ED patient denies chest pain fevers chills abdominal pain.  States he has not been able to eat or drink over the last few days.   Weakness Associated symptoms: vomiting   Emesis      Home Medications Prior to Admission medications   Not on File      Allergies    Ham flavoring agent (non-screening)    Review of Systems   Review of Systems  Gastrointestinal:  Positive for vomiting.  Neurological:  Positive for weakness.    Physical Exam Updated Vital Signs BP 104/79   Pulse (!) 125   Temp 99 F (37.2 C) (Rectal)   Resp 18   Ht 5\' 8"  (1.727 m)   Wt 75 kg   SpO2 100%   BMI 25.14 kg/m  Physical Exam Vitals and nursing note reviewed.  HENT:     Head: Normocephalic and atraumatic.  Eyes:     Pupils: Pupils are equal, round, and reactive to light.  Cardiovascular:     Rate and Rhythm: Normal rate and regular rhythm.  Pulmonary:     Effort: Pulmonary effort is normal.     Breath sounds: Normal breath sounds.  Abdominal:     Palpations: Abdomen is soft.     Tenderness: There is no abdominal tenderness.  Musculoskeletal:     Comments: Atrophy of upper and lower extremities 4-5 motor strength symmetric through bilateral upper and lower extremities Sensation tact light touch throughout No midline tenderness or step-off deformities of  back No sacral wounds  Skin:    General: Skin is warm and dry.  Neurological:     General: No focal deficit present.     Mental Status: He is alert and oriented to person, place, and time.  Psychiatric:        Mood and Affect: Mood normal.     ED Results / Procedures / Treatments   Labs (all labs ordered are listed, but only abnormal results are displayed) Labs Reviewed  COMPREHENSIVE METABOLIC PANEL WITH GFR - Abnormal; Notable for the following components:      Result Value   Potassium 3.0 (*)    Glucose, Bld 139 (*)    AST 67 (*)    ALT 54 (*)    Alkaline Phosphatase 146 (*)    Total Bilirubin 2.4 (*)    All other components within normal limits  CBC WITH DIFFERENTIAL/PLATELET - Abnormal; Notable for the following components:   WBC 14.8 (*)    Neutro Abs 13.0 (*)    All other components within normal limits  URINALYSIS, ROUTINE W REFLEX MICROSCOPIC - Abnormal; Notable for the following components:   Color, Urine AMBER (*)    APPearance HAZY (*)    Glucose, UA 50 (*)    Hgb  urine dipstick SMALL (*)    Ketones, ur 5 (*)    Protein, ur 30 (*)    All other components within normal limits  CBG MONITORING, ED - Abnormal; Notable for the following components:   Glucose-Capillary 140 (*)    All other components within normal limits  I-STAT ARTERIAL BLOOD GAS, ED - Abnormal; Notable for the following components:   pH, Arterial 7.244 (*)    pCO2 arterial 60.9 (*)    Potassium 3.3 (*)    All other components within normal limits  I-STAT CG4 LACTIC ACID, ED - Abnormal; Notable for the following components:   Lactic Acid, Venous 3.3 (*)    All other components within normal limits  RESP PANEL BY RT-PCR (RSV, FLU A&B, COVID)  [id number]  CULTURE, BLOOD (ROUTINE X 2)  CULTURE, BLOOD (ROUTINE X 2)  LIPASE, BLOOD  MAGNESIUM  BLOOD GAS, ARTERIAL  HIV ANTIBODY (ROUTINE TESTING W REFLEX)  POC OCCULT BLOOD, ED  I-STAT CG4 LACTIC ACID, ED    EKG EKG  Interpretation Date/Time:  Saturday December 13 2023 08:25:05 EDT Ventricular Rate:  103 PR Interval:  164 QRS Duration:  180 QT Interval:  398 QTC Calculation: 521 R Axis:   -38  Text Interpretation: Sinus tachycardia Multiple ventricular premature complexes Left bundle branch block Confirmed by Estelle June [phone]) on 12/13/2023 11:48:30 AM  Radiology CT Angio Chest Pulmonary Embolism (PE) W or WO Contrast Result Date: 12/13/2023 CLINICAL DATA:  Shortness of breath and vomiting for the past 3 days. Respiratory arrest. EXAM: CT ANGIOGRAPHY CHEST CT ABDOMEN AND PELVIS WITH CONTRAST TECHNIQUE: Multidetector CT imaging of the chest was performed using the standard protocol during bolus administration of intravenous contrast. Multiplanar CT image reconstructions and MIPs were obtained to evaluate the vascular anatomy. Multidetector CT imaging of the abdomen and pelvis was performed using the standard protocol during bolus administration of intravenous contrast. RADIATION DOSE REDUCTION: This exam was performed according to the departmental dose-optimization program which includes automated exposure control, adjustment of the mA and/or kV according to patient size and/or use of iterative reconstruction technique. CONTRAST:  75mL OMNIPAQUE IOHEXOL 350 MG/ML SOLN COMPARISON:  Chest x-rays from same day. CTA chest abdomen, and pelvis dated Jan 27, 2021. FINDINGS: CTA CHEST FINDINGS Cardiovascular: Satisfactory opacification of the pulmonary arteries to the segmental level. No evidence of pulmonary embolism. Normal heart size. No pericardial effusion. No evidence of thoracic aortic aneurysm or dissection. Mediastinum/Nodes: No enlarged mediastinal, hilar, or axillary lymph nodes. Patulous esophagus with debris distally. Enteric tube tip at the GE junction. Normal thyroid gland. Lungs/Pleura: Endotracheal tube tip 1.8 cm above the carina. Debris within the left mainstem bronchus and multiple left lower lobe  bronchi. Near complete collapse of the left lower lobe with areas of consolidation. Extensive ill-defined centrilobular ground-glass nodules throughout the left upper lobe with patchy areas of consolidation posteriorly. There also ill-defined centrilobular ground-glass nodules throughout the right lung, but to a milder degree. There are two areas of loculated pleural air in the right lung, the first along the major fissure measuring 1.9 cm AP x 6.3 cm TR in the second inferior to the right middle lobe, measuring 1.6 cm AP x 3.4 cm TR (series 4, images 90 and 123). No pleural effusion. Musculoskeletal: No chest wall abnormality. Acute nondisplaced fracture of the right lateral fourth rib. Review of the MIP images confirms the above findings. CT ABDOMEN PELVIS FINDINGS Hepatobiliary: No focal liver abnormality is seen. Multiple gallstones again noted. No gallbladder wall thickening  or biliary dilatation. Pancreas: Unremarkable. No pancreatic ductal dilatation or surrounding inflammatory changes. Spleen: Normal in size without focal abnormality. Adrenals/Urinary Tract: Adrenal glands are unremarkable. Kidneys are normal, without renal calculi, solid lesion, or hydronephrosis. Bladder is unremarkable. Stomach/Bowel: Stomach is within normal limits. Appendix appears normal. No evidence of bowel wall thickening, distention, or inflammatory changes. Vascular/Lymphatic: No significant vascular findings are present. No enlarged abdominal or pelvic lymph nodes. Reproductive: Prostate is unremarkable. Other: No abdominal wall hernia or abnormality. No abdominopelvic ascites. No pneumoperitoneum. Musculoskeletal: No acute or significant osseous findings. Review of the MIP images confirms the above findings. IMPRESSION: Chest: 1. No evidence of pulmonary embolism. 2. Debris within the left mainstem bronchus and multiple left lower lobe bronchi with near complete collapse of the left lower lobe and extensive multifocal aspiration  pneumonia, worse in the left lung. 3. Two overall small areas of loculated pleural air in the right lung, possibly due to barotrauma. 4. Acute nondisplaced fracture of the right lateral fourth rib. 5. Enteric tube tip at the GE junction. Recommend advancement. Abdomen and pelvis: 1. No acute intra-abdominal process. 2. Unchanged cholelithiasis. Electronically Signed   By: Obie Dredge M.D.   On: 12/13/2023 11:43   CT ABDOMEN PELVIS W CONTRAST Result Date: 12/13/2023 CLINICAL DATA:  Shortness of breath and vomiting for the past 3 days. Respiratory arrest. EXAM: CT ANGIOGRAPHY CHEST CT ABDOMEN AND PELVIS WITH CONTRAST TECHNIQUE: Multidetector CT imaging of the chest was performed using the standard protocol during bolus administration of intravenous contrast. Multiplanar CT image reconstructions and MIPs were obtained to evaluate the vascular anatomy. Multidetector CT imaging of the abdomen and pelvis was performed using the standard protocol during bolus administration of intravenous contrast. RADIATION DOSE REDUCTION: This exam was performed according to the departmental dose-optimization program which includes automated exposure control, adjustment of the mA and/or kV according to patient size and/or use of iterative reconstruction technique. CONTRAST:  75mL OMNIPAQUE IOHEXOL 350 MG/ML SOLN COMPARISON:  Chest x-rays from same day. CTA chest abdomen, and pelvis dated Jan 27, 2021. FINDINGS: CTA CHEST FINDINGS Cardiovascular: Satisfactory opacification of the pulmonary arteries to the segmental level. No evidence of pulmonary embolism. Normal heart size. No pericardial effusion. No evidence of thoracic aortic aneurysm or dissection. Mediastinum/Nodes: No enlarged mediastinal, hilar, or axillary lymph nodes. Patulous esophagus with debris distally. Enteric tube tip at the GE junction. Normal thyroid gland. Lungs/Pleura: Endotracheal tube tip 1.8 cm above the carina. Debris within the left mainstem bronchus and  multiple left lower lobe bronchi. Near complete collapse of the left lower lobe with areas of consolidation. Extensive ill-defined centrilobular ground-glass nodules throughout the left upper lobe with patchy areas of consolidation posteriorly. There also ill-defined centrilobular ground-glass nodules throughout the right lung, but to a milder degree. There are two areas of loculated pleural air in the right lung, the first along the major fissure measuring 1.9 cm AP x 6.3 cm TR in the second inferior to the right middle lobe, measuring 1.6 cm AP x 3.4 cm TR (series 4, images 90 and 123). No pleural effusion. Musculoskeletal: No chest wall abnormality. Acute nondisplaced fracture of the right lateral fourth rib. Review of the MIP images confirms the above findings. CT ABDOMEN PELVIS FINDINGS Hepatobiliary: No focal liver abnormality is seen. Multiple gallstones again noted. No gallbladder wall thickening or biliary dilatation. Pancreas: Unremarkable. No pancreatic ductal dilatation or surrounding inflammatory changes. Spleen: Normal in size without focal abnormality. Adrenals/Urinary Tract: Adrenal glands are unremarkable. Kidneys are normal, without renal  calculi, solid lesion, or hydronephrosis. Bladder is unremarkable. Stomach/Bowel: Stomach is within normal limits. Appendix appears normal. No evidence of bowel wall thickening, distention, or inflammatory changes. Vascular/Lymphatic: No significant vascular findings are present. No enlarged abdominal or pelvic lymph nodes. Reproductive: Prostate is unremarkable. Other: No abdominal wall hernia or abnormality. No abdominopelvic ascites. No pneumoperitoneum. Musculoskeletal: No acute or significant osseous findings. Review of the MIP images confirms the above findings. IMPRESSION: Chest: 1. No evidence of pulmonary embolism. 2. Debris within the left mainstem bronchus and multiple left lower lobe bronchi with near complete collapse of the left lower lobe and  extensive multifocal aspiration pneumonia, worse in the left lung. 3. Two overall small areas of loculated pleural air in the right lung, possibly due to barotrauma. 4. Acute nondisplaced fracture of the right lateral fourth rib. 5. Enteric tube tip at the GE junction. Recommend advancement. Abdomen and pelvis: 1. No acute intra-abdominal process. 2. Unchanged cholelithiasis. Electronically Signed   By: Obie Dredge M.D.   On: 12/13/2023 11:43   DG Chest Portable 1 View Result Date: 12/13/2023 CLINICAL DATA:  Endotracheal tube adjustment. EXAM: PORTABLE CHEST 1 VIEW COMPARISON:  Chest x-ray from same day at 1037 hours. FINDINGS: Interval retraction of the endotracheal tube with the tip now 1.5 cm above the carina. Unchanged enteric tube with the tip near the GE junction. Stable cardiomediastinal silhouette. Similar patchy airspace opacities throughout the left upper lobe. Unchanged collapse/consolidation of the left lower lobe. Few subtle patchy opacities in the right mid and lower lung. No pneumothorax or large pleural effusion. No acute osseous abnormality. IMPRESSION: 1. Interval retraction of the endotracheal tube with the tip now 1.5 cm above the carina. 2. Unchanged enteric tube with the tip near the GE junction. Recommend advancement. 3. Similar multifocal aspiration pneumonia. Electronically Signed   By: Obie Dredge M.D.   On: 12/13/2023 11:25   DG Chest Portable 1 View Result Date: 12/13/2023 CLINICAL DATA:  Status post intubation. EXAM: PORTABLE CHEST 1 VIEW COMPARISON:  Chest x-ray from same day at 0907 hours. FINDINGS: Interval intubation with endotracheal tube tip in the right mainstem bronchus. New enteric tube with the tip near the GE junction. Stable cardiomediastinal silhouette. New patchy consolidation in the left mid lung. Worsening aeration of the left lower lobe. No pneumothorax or large pleural effusion. No acute osseous abnormality. IMPRESSION: 1. Endotracheal tube tip in the  right mainstem bronchus. At the time of this dictation, the tube has already been retracted on subsequent follow-up chest x-ray. 2. New enteric tube with the tip near the GE junction. Recommend advancement. 3. New patchy consolidation in the left mid lung, concerning for aspiration. Worsening aeration of the left lower lobe may in part be related to right mainstem intubation. Electronically Signed   By: Obie Dredge M.D.   On: 12/13/2023 11:20   DG Chest Portable 1 View Result Date: 12/13/2023 CLINICAL DATA:  Shortness of breath EXAM: PORTABLE CHEST 1 VIEW COMPARISON:  01/27/2021 FINDINGS: The heart size and mediastinal contours are within normal limits. Low lung volumes. Streaky bibasilar opacities. No pleural effusion or pneumothorax. The visualized skeletal structures are unremarkable. IMPRESSION: Low lung volumes with streaky bibasilar opacities, likely atelectasis. Electronically Signed   By: Duanne Guess D.O.   On: 12/13/2023 09:24    Procedures .Critical Care  Performed by: Royanne Foots, DO Authorized by: Royanne Foots, DO   Critical care provider statement:    Critical care time (minutes):  85   Critical care  time was exclusive of:  Separately billable procedures and treating other patients and teaching time   Critical care was necessary to treat or prevent imminent or life-threatening deterioration of the following conditions:  Cardiac failure and respiratory failure   Critical care was time spent personally by me on the following activities:  Development of treatment plan with patient or surrogate, discussions with consultants, evaluation of patient's response to treatment, examination of patient, ordering and review of laboratory studies, ordering and review of radiographic studies, ordering and performing treatments and interventions, pulse oximetry, re-evaluation of patient's condition, review of old charts and obtaining history from patient or surrogate   I assumed  direction of critical care for this patient from another provider in my specialty: no     Care discussed with: admitting provider   Procedure Name: Intubation Date/Time: 12/13/2023 10:30 AM  Performed by: Royanne Foots, DOPre-anesthesia Checklist: Patient identified, Patient being monitored, Emergency Drugs available, Timeout performed and Suction available Oxygen Delivery Method: Non-rebreather mask Preoxygenation: Pre-oxygenation with 100% oxygen Induction Type: Rapid sequence Ventilation: Mask ventilation without difficulty Laryngoscope Size: Glidescope and 4 Grade View: Grade I Tube size: 8.0 mm Number of attempts: 1 Airway Equipment and Method: Video-laryngoscopy Placement Confirmation: ETT inserted through vocal cords under direct vision, CO2 detector and Breath sounds checked- equal and bilateral Secured at: 23 cm Tube secured with: ETT holder        Medications Ordered in ED Medications  azithromycin (ZITHROMAX) 500 mg in sodium chloride 0.9 % 250 mL IVPB (has no administration in time range)  amiodarone (NEXTERONE PREMIX) 360-4.14 MG/200ML-% (1.8 mg/mL) IV infusion (60 mg/hr Intravenous New Bag/Given 12/13/23 1125)  amiodarone (NEXTERONE PREMIX) 360-4.14 MG/200ML-% (1.8 mg/mL) IV infusion (has no administration in time range)  magnesium sulfate IVPB 2 g 50 mL (2 g Intravenous New Bag/Given 12/13/23 1120)  amiodarone (NEXTERONE) 1.8 mg/mL load via infusion 150 mg (has no administration in time range)  0.9 %  sodium chloride infusion (has no administration in time range)  norepinephrine (LEVOPHED) 4mg  in (0.016 mg/mL) premix infusion (10 mcg/min Intravenous New Bag/Given 12/13/23 1125)  docusate (COLACE) 50 MG/5ML liquid 100 mg (has no administration in time range)  polyethylene glycol (MIRALAX / GLYCOLAX) packet 17 g (has no administration in time range)  famotidine (PEPCID) tablet 20 mg (has no administration in time range)  propofol (DIPRIVAN) 1000 MG/100ML infusion  (5 mcg/kg/min  75 kg Intravenous New Bag/Given 12/13/23 1126)  fentaNYL in NS (32mcg/ml) infusion-PREMIX (has no administration in time range)  fentaNYL (SUBLIMAZE) bolus via infusion 50-100 mcg (has no administration in time range)  sodium chloride 0.9 % bolus 1,000 mL (0 mLs Intravenous Stopped 12/13/23 1029)  ondansetron (ZOFRAN) injection 4 mg (4 mg Intravenous Given 12/13/23 0930)  cefTRIAXone (ROCEPHIN) 1 g in sodium chloride 0.9 % 100 mL IVPB (0 g Intravenous Stopped 12/13/23 1029)  EPINEPHrine (ADRENALIN) 1 MG/10ML injection (1 mg Intravenous Given 12/13/23 1014)  EPINEPHrine (ADRENALIN) 1 MG/10ML injection (1 mg Intravenous Given 12/13/23 1017)  etomidate (AMIDATE) injection (10 mg Intravenous Given 12/13/23 1019)  rocuronium (ZEMURON) injection (100 mg Intravenous Given 12/13/23 1019)  amiodarone (NEXTERONE) 1.5 mg/mL IV bolus only ( Intravenous New Bag/Given 12/13/23 1024)  iohexol (OMNIPAQUE) 350 MG/ML injection 75 mL (75 mLs Intravenous Contrast Given 12/13/23 1111)    ED Course/ Medical Decision Making/ A&P Clinical Course as of 12/13/23 1152  Sat Dec 13, 2023  0903 Venous lactate of 3.3 with ABG consistent with respiratory acidosis.  Again, high  suspicion for aspiration pneumonia.  Awaiting chest x-ray but will cover empirically for respiratory source with ceftriaxone and azithromycin.  Reassessed patient's respiratory and hemodynamic status.  Stable on 15 L nonrebreather.  He remains normotensive at this time [MP]  1020 I was called into the patient's room given concern for unresponsiveness.  He was noted to be pulseless and CPR was initiated.  Hypoxemia with oxygen saturation in the 40s.  Also initiated respirations through bag valve mask with improvement in oxygen saturation.  We regained pulses after 1 round of CPR and no epi was given however he arrested again.  Epinephrine was administered and we were able to regain pulses second time  Unfortunately the patient did  arrest again.  I performed endotracheal intubation with ongoing CPR.  Epinephrine was administered.  Pulses were regained.  V. tach on monitor.  1 synchronized cardioversion was administered for unstable V. tach.  Amiodarone bolus and drip were also initiated along with IV magnesium  Initial placement of ET tube showed right mainstem intubation on chest x-ray.  The tube was retracted and was seen in satisfactory position above the carina on repeat chest x-ray.  CT shows no evidence of PE as this was a consideration given hypoxemia and arrest.  CT most consistent with aspiration pneumonia  I called patient's sister, Olegario Messier, and healthcare proxy who is currently in [place].  She does add that patient's respiratory status has been declining and he has had issues with aspiration in the past.  I updated her on today's clinical course and the patient's critical condition.  She will make arrangements to travel here and will share the news with the patient's mother as well  Discussed care with ICU team who accepts patient for admission [MP]    Clinical Course User Index [MP] Royanne Foots, DO                                 Medical Decision Making 58 year old male with history as above presenting from home after several days of persistent nausea vomiting decreased p.o. intake and generalized weakness.  Noted to be hypoxic on 2 L nasal cannula on my initial assessment.  Afebrile normotensive.  Tachypneic.  Improvement to oxygen saturation of high 90s on 15 L nonrebreather.  High suspicion for aspiration pneumonia.  Will obtain stat chest x-ray.  Other differentials to consider would be urinary tract infection, intra-abdominal infection, electrolyte imbalance in the setting of persistent vomiting and dysrhythmia.  Will obtain infectious workup EKG UA and provide IV fluids and Zofran.  Admission likely  Amount and/or Complexity of Data Reviewed Labs: ordered. Radiology: ordered.  Risk Prescription drug  management. Decision regarding hospitalization.           Final Clinical Impression(s) / ED Diagnoses Final diagnoses:  Cardiac arrest (HCC)  Acute hypoxemic respiratory failure (HCC)  Aspiration into airway, initial encounter    Rx / DC Orders ED Discharge Orders     None         Royanne Foots, DO 12/13/23 1152

## 2023-12-13 NOTE — IPAL (Signed)
  Interdisciplinary Goals of Care Family Meeting   Date carried out:: 12/13/2023  Location of the meeting: Phone conference  Member's involved: Physician, Bedside Registered Nurse, and Family Member or next of kin  Durable Power of Attorney or acting medical decision maker: patient's sister, Mitchell Maldonado  Discussion: We discussed goals of care for Mitchell Maldonado .   Mitchell Maldonado indicated that her brother does not seek medical care, does not have formal DNR orders in place but that he has indicated that he would not want extraordinary prolonged measures if he did not have a chance to achieve his previous baseline quality of life.  She also notes that his quality life has declined some over a year, complicated by some psychiatric disease.  She is clear that he would not want and is not appropriate for extraordinary measures, heroic efforts.  Given that we have already somewhat achieved stability with ACLS, mechanical ventilation, pressors, she is okay maintaining these interventions at least short-term to see if he can turn around quickly.  If he declines then he should not undergo further CPR and we should consider transition to comfort.  If he does not improve over the next couple days or if his prognosis is poor for meaningful recovery then she would favor a transition to comfort care.  Code status: Full DNR if arrests but maintain current level of support.  Consider procedures if they can help achieve short-term stability.  Disposition: Continue current acute care   Time spent for the meeting: 15 minutes  Mitchell Maldonado 12/13/2023, 1:56 PM

## 2023-12-13 NOTE — Progress Notes (Signed)
 eLink Physician-Brief Progress Note Patient Name: Mitchell Maldonado DOB: 05/30/1966 MRN: [id number]   Date of Service  12/13/2023  HPI/Events of Note  Patient is postcardiac arrest.  At the moment he is in no CPR.  He is maxed out on norepinephrine with a blood pressure of 71/47.  eICU Interventions  Will add vasopressin to his regimen to see if it improves his hemodynamics.  Discussed with bedside RN and eLink nurse.     Intervention Category Major Interventions: Shock - evaluation and management  Carilyn Goodpasture 12/13/2023, 10:26 PM

## 2023-12-14 ENCOUNTER — Inpatient Hospital Stay (HOSPITAL_COMMUNITY)

## 2023-12-14 DIAGNOSIS — I469 Cardiac arrest, cause unspecified: Secondary | ICD-10-CM | POA: Diagnosis not present

## 2023-12-14 DIAGNOSIS — J9601 Acute respiratory failure with hypoxia: Secondary | ICD-10-CM | POA: Diagnosis not present

## 2023-12-14 DIAGNOSIS — E43 Unspecified severe protein-calorie malnutrition: Secondary | ICD-10-CM

## 2023-12-14 DIAGNOSIS — R570 Cardiogenic shock: Secondary | ICD-10-CM | POA: Diagnosis not present

## 2023-12-14 DIAGNOSIS — K7200 Acute and subacute hepatic failure without coma: Secondary | ICD-10-CM

## 2023-12-14 DIAGNOSIS — S270XXA Traumatic pneumothorax, initial encounter: Secondary | ICD-10-CM | POA: Diagnosis not present

## 2023-12-14 LAB — BASIC METABOLIC PANEL WITH GFR
Anion gap: 13 (ref 5–15)
Anion gap: 12 (ref 5–15)
Anion gap: 12 (ref 5–15)
BUN: 15 mg/dL (ref 6–20)
BUN: 14 mg/dL (ref 6–20)
BUN: 13 mg/dL (ref 6–20)
CO2: 17 mmol/L — ABNORMAL LOW (ref 22–32)
CO2: 21 mmol/L — ABNORMAL LOW (ref 22–32)
CO2: 21 mmol/L — ABNORMAL LOW (ref 22–32)
Calcium: 7.5 mg/dL — ABNORMAL LOW (ref 8.9–10.3)
Calcium: 7.3 mg/dL — ABNORMAL LOW (ref 8.9–10.3)
Calcium: 7.3 mg/dL — ABNORMAL LOW (ref 8.9–10.3)
Chloride: 104 mmol/L (ref 98–111)
Chloride: 105 mmol/L (ref 98–111)
Chloride: 103 mmol/L (ref 98–111)
Creatinine, Ser: 1.02 mg/dL (ref 0.61–1.24)
Creatinine, Ser: 0.9 mg/dL (ref 0.61–1.24)
Creatinine, Ser: 0.93 mg/dL (ref 0.61–1.24)
GFR, Estimated: >60 mL/min
GFR, Estimated: >60 mL/min (ref >60)
GFR, Estimated: >60 mL/min (ref >60)
Glucose, Bld: 192 mg/dL — ABNORMAL HIGH (ref 70–99)
Glucose, Bld: 131 mg/dL — ABNORMAL HIGH (ref 70–99)
Glucose, Bld: 117 mg/dL — ABNORMAL HIGH (ref 70–99)
Potassium: 5.1 mmol/L (ref 3.5–5.1)
Potassium: 2.9 mmol/L — ABNORMAL LOW (ref 3.5–5.1)
Potassium: 3.4 mmol/L — ABNORMAL LOW (ref 3.5–5.1)
Sodium: 134 mmol/L — ABNORMAL LOW (ref 135–145)
Sodium: 138 mmol/L (ref 135–145)
Sodium: 136 mmol/L (ref 135–145)

## 2023-12-14 LAB — CBC
HCT: 40.7 % (ref 39.0–52.0)
Hemoglobin: 13.7 g/dL (ref 13.0–17.0)
MCH: 28.1 pg (ref 26.0–34.0)
MCHC: 33.7 g/dL (ref 30.0–36.0)
MCV: 83.4 fL (ref 80.0–100.0)
Platelets: 209 10*3/uL (ref 150–400)
RBC: 4.88 MIL/uL (ref 4.22–5.81)
RDW: 12.9 % (ref 11.5–15.5)
WBC: 18.4 10*3/uL — ABNORMAL HIGH (ref 4.0–10.5)
nRBC: 0 % (ref 0.0–0.2)

## 2023-12-14 LAB — POCT I-STAT 7, (LYTES, BLD GAS, ICA,H+H)
Acid-base deficit: 3 mmol/L — ABNORMAL HIGH (ref 0.0–2.0)
Bicarbonate: 18.8 mmol/L — ABNORMAL LOW (ref 20.0–28.0)
Calcium, Ion: 1.12 mmol/L — ABNORMAL LOW (ref 1.15–1.40)
HCT: 38 % — ABNORMAL LOW (ref 39.0–52.0)
Hemoglobin: 12.9 g/dL — ABNORMAL LOW (ref 13.0–17.0)
O2 Saturation: 100 %
Patient temperature: 38.3
Potassium: 5.1 mmol/L (ref 3.5–5.1)
Sodium: 132 mmol/L — ABNORMAL LOW (ref 135–145)
TCO2: 20 mmol/L — ABNORMAL LOW (ref 22–32)
pCO2 arterial: 27.6 mmHg — ABNORMAL LOW (ref 32–48)
pH, Arterial: 7.446 (ref 7.35–7.45)
pO2, Arterial: 362 mmHg — ABNORMAL HIGH (ref 83–108)

## 2023-12-14 LAB — TRIGLYCERIDES: Triglycerides: 88 mg/dL (ref <150)

## 2023-12-14 LAB — MAGNESIUM
Magnesium: 2.1 mg/dL (ref 1.7–2.4)
Magnesium: 1.9 mg/dL (ref 1.7–2.4)
Magnesium: 2.5 mg/dL — ABNORMAL HIGH (ref 1.7–2.4)

## 2023-12-14 LAB — COOXEMETRY PANEL
Carboxyhemoglobin: 0.8 % (ref 0.5–1.5)
Methemoglobin: 1.3 % (ref 0.0–1.5)
O2 Saturation: 57.3 %
Total hemoglobin: 14.2 g/dL (ref 12.0–16.0)

## 2023-12-14 LAB — HEPATIC FUNCTION PANEL
ALT: 101 U/L — ABNORMAL HIGH (ref 0–44)
AST: 71 U/L — ABNORMAL HIGH (ref 15–41)
Albumin: 2 g/dL — ABNORMAL LOW (ref 3.5–5.0)
Alkaline Phosphatase: 96 U/L (ref 38–126)
Bilirubin, Direct: 1.2 mg/dL — ABNORMAL HIGH (ref 0.0–0.2)
Indirect Bilirubin: 1 mg/dL — ABNORMAL HIGH (ref 0.3–0.9)
Total Bilirubin: 2.2 mg/dL — ABNORMAL HIGH (ref 0.0–1.2)
Total Protein: 5 g/dL — ABNORMAL LOW (ref 6.5–8.1)

## 2023-12-14 LAB — PHOSPHORUS
Phosphorus: <1 mg/dL — CL (ref 2.5–4.6)
Phosphorus: 3.1 mg/dL (ref 2.5–4.6)
Phosphorus: 1.1 mg/dL — ABNORMAL LOW (ref 2.5–4.6)

## 2023-12-14 LAB — CORTISOL: Cortisol, Plasma: 26.1 ug/dL

## 2023-12-14 LAB — LACTIC ACID, PLASMA
Lactic Acid, Venous: 1.8 mmol/L (ref 0.5–1.9)
Lactic Acid, Venous: 1.4 mmol/L (ref 0.5–1.9)

## 2023-12-14 LAB — GLUCOSE, CAPILLARY
Glucose-Capillary: 141 mg/dL — ABNORMAL HIGH (ref 70–99)
Glucose-Capillary: 96 mg/dL (ref 70–99)

## 2023-12-14 LAB — TROPONIN I (HIGH SENSITIVITY): Troponin I (High Sensitivity): 58 ng/L — ABNORMAL HIGH (ref <18)

## 2023-12-14 MED ORDER — ENOXAPARIN SODIUM 40 MG/0.4ML IJ SOSY
40.0000 mg | PREFILLED_SYRINGE | INTRAMUSCULAR | Status: DC
  Administered 2023-12-14 – 2023-12-15 (×2): 40 mg via SUBCUTANEOUS
  Filled 2023-12-14 (×2): qty 0.4

## 2023-12-14 MED ORDER — SODIUM BICARBONATE 8.4 % IV SOLN
50.0000 meq | Freq: Once | INTRAVENOUS | Status: AC
  Administered 2023-12-14: 50 meq via INTRAVENOUS
  Filled 2023-12-14: qty 50

## 2023-12-14 MED ORDER — POTASSIUM PHOSPHATES 15 MMOLE/5ML IV SOLN
45.0000 mmol | Freq: Once | INTRAVENOUS | Status: AC
  Administered 2023-12-14: 45 mmol via INTRAVENOUS
  Filled 2023-12-14: qty 15

## 2023-12-14 MED ORDER — STERILE WATER FOR INJECTION IV SOLN
INTRAVENOUS | Status: AC
  Filled 2023-12-14: qty 1000

## 2023-12-14 MED ORDER — SENNA 8.6 MG PO TABS: 2.0000 | ORAL_TABLET | Freq: Every day | ORAL | Status: DC

## 2023-12-14 MED ORDER — SODIUM PHOSPHATES 45 MMOLE/15ML IV SOLN
30.0000 mmol | Freq: Once | INTRAVENOUS | Status: AC
  Administered 2023-12-14: 30 mmol via INTRAVENOUS
  Filled 2023-12-14: qty 10

## 2023-12-14 MED ORDER — ALBUMIN HUMAN 25 % IV SOLN
25.0000 g | Freq: Four times a day (QID) | INTRAVENOUS | Status: AC
  Administered 2023-12-14 – 2023-12-15 (×4): 25 g via INTRAVENOUS
  Filled 2023-12-14 (×4): qty 100

## 2023-12-14 MED ORDER — SODIUM CHLORIDE 0.9% FLUSH
10.0000 mL | Freq: Three times a day (TID) | INTRAVENOUS | Status: DC
  Administered 2023-12-14 – 2023-12-15 (×4): 10 mL via INTRAPLEURAL

## 2023-12-14 MED ORDER — POTASSIUM CHLORIDE 10 MEQ/50ML IV SOLN
10.0000 meq | INTRAVENOUS | Status: AC
  Administered 2023-12-14 (×6): 10 meq via INTRAVENOUS
  Filled 2023-12-14 (×2): qty 50

## 2023-12-14 MED ORDER — ETOMIDATE 2 MG/ML IV SOLN
20.0000 mg | Freq: Once | INTRAVENOUS | Status: AC
  Administered 2023-12-14: 20 mg via INTRAVENOUS

## 2023-12-14 MED ORDER — ETOMIDATE 2 MG/ML IV SOLN
10.0000 mg | Freq: Once | INTRAVENOUS | Status: DC
Start: 2023-12-14 — End: 2023-12-14

## 2023-12-14 MED ORDER — ACETAMINOPHEN 650 MG RE SUPP: 650.0000 mg | RECTAL | Status: DC | PRN

## 2023-12-14 MED ORDER — POTASSIUM CHLORIDE 10 MEQ/100ML IV SOLN
10.0000 meq | INTRAVENOUS | Status: DC
  Filled 2023-12-14: qty 100

## 2023-12-14 MED ORDER — ACETAMINOPHEN 10 MG/ML IV SOLN
1000.0000 mg | Freq: Four times a day (QID) | INTRAVENOUS | Status: AC
  Administered 2023-12-14 – 2023-12-15 (×4): 1000 mg via INTRAVENOUS
  Filled 2023-12-14 (×4): qty 100

## 2023-12-14 MED ORDER — PANTOPRAZOLE SODIUM 40 MG IV SOLR
40.0000 mg | Freq: Every day | INTRAVENOUS | Status: DC
Start: 2023-12-14 — End: 2023-12-16
  Administered 2023-12-14: 40 mg via INTRAVENOUS
  Filled 2023-12-14: qty 10

## 2023-12-14 MED ORDER — ETOMIDATE 2 MG/ML IV SOLN
INTRAVENOUS | Status: AC
  Filled 2023-12-14: qty 10

## 2023-12-14 MED ORDER — MAGNESIUM SULFATE 2 GM/50ML IV SOLN
2.0000 g | Freq: Once | INTRAVENOUS | Status: AC
Start: 2023-12-14 — End: 2023-12-14
  Administered 2023-12-14: 2 g via INTRAVENOUS
  Filled 2023-12-14: qty 50

## 2023-12-14 MED ORDER — POLYETHYLENE GLYCOL 3350 17 G PO PACK: 17.0000 g | PACK | Freq: Every day | ORAL | Status: DC

## 2023-12-14 MED ORDER — POTASSIUM CHLORIDE 20 MEQ PO PACK: 40.0000 meq | PACK | Freq: Once | ORAL | Status: DC

## 2023-12-14 NOTE — Progress Notes (Signed)
 SLP Cancellation Note  Patient Details Name: CLARANCE BOLLARD MRN: [id number] DOB: [birth year]   Cancelled treatment:       Reason Eval/Treat Not Completed: Patient not medically ready. Pt remains intubated, note MD concern for chronic aspiration and MBS prior to d/c. Will follow closely for readiness.    Quinne Pires, Riley Nearing 12/14/2023, 10:37 AM

## 2023-12-14 NOTE — Progress Notes (Addendum)
 NAME:  Mitchell Maldonado, MRN:  [id number], DOB:  Jan 25, 1966, LOS: 1 ADMISSION DATE:  12/13/2023, CONSULTATION DATE: 12/13/2023 REFERRING MD: Dr. Elayne Snare, CHIEF COMPLAINT: Cardiopulmonary arrest  History of Present Illness:  58 year old man, history of tobacco use, myotonic dystrophy (confirmed by EMG).  He presented to the ED on 3/29 with 3 to 4 days of ongoing nausea vomiting and progressive weakness.  He called EMS on 3/29 and was noted to be hypoxemic, SpO2 80's% on RA.  Initially improved with nasal cannula.  He did deny fevers, chills, abdominal pain.  He had minimal p.o. intake for 4 days.  Influenza, COVID-19 and RSV all negative Required escalation of his FiO2, ABG with respiratory acidosis and hypoxemia.  Other labs significant for hypokalemia and mild transaminitis.  He is involved progressive respiratory failure, ultimately pulselessness.  CPR initiated, initially PEA and then wide-complex tachycardia from which he was cardioverted.  Amiodarone bolus and infusion initiated.  Also received IV magnesium.  Endotracheally intubated and stability achieved.  Postintubation chest x-ray with bilateral patchy airspace opacities particularly on the left, less prominent in the right midlung consistent with pneumonia.   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  No mediastinal or hilar adenopathy.  Apparent mucus or debris in the left mainstem bronchus and multiple left lower lobe bronchi with left lower lobe consolidation.  Extensive centrilobular groundglass nodularity in the left upper lobe, right midlung.  2 local areas of loculated air in the right midlung with an acute  nondisplaced right lateral fourth rib fracture. CT abdomen/pelvis 3/29 >> stable cholelithiasis, otherwise normal  Interim History / Subjective:  No events, on fairly high dose pressors.  Objective   Blood pressure (!) 74/61, pulse 68, temperature (!) 100.4 F (38 C), resp. rate 18, height 5\' 8"  (1.727 m), weight 64.4 kg, SpO2 100%. CVP:  [3 mmHg-14 mmHg] 6 mmHg  Vent Mode: PRVC FiO2 (%):  [40 %-100 %] 40 % Set Rate:  [18 bmp-24 bmp] 18 bmp Vt Set:  [550 mL] 550 mL PEEP:  [5 cmH20-10 cmH20] 5 cmH20 Plateau Pressure:  [16 cmH20-28 cmH20] 16 cmH20   Intake/Output Summary (Last 24 hours) at 12/14/2023 0856 Last data filed at 12/14/2023 0800 Gross per 24 hour  Intake 4550.09 ml  Output 800 ml  Net 3750.09 ml   Filed Weights   12/13/23 1030 12/14/23 0248  Weight: 75 kg 64.4 kg    Examination: Chronically ill Thick brown secretions Good cough Follows commands x 4 +muscle wasting +rhonci Lung mechanics okay 1+ air leak on R  Phos getting repleted  Resolved Hospital Problem list      Assessment & Plan:  PEA arrest- culprit almost certainly seems to be progressive respiratory failure; aspiration PNA leading differential with background progressive FTT, muscular dystrophy. Acute hypoxemic respiratory failure- leading to above Mixed septic/cardiogenic shock post arrest- initial trop neg, new LBBB; still suspect most likely Type II Hypophosphatemia- being repleted Severe protein calorie malnutrition POA Likely chronic aspiration syndrome from progressive muscular dystrophy- however, also has Hiatal hernia, difficult passing NGT- should rule out stricture or gastric outlet obstruction if history is consistent, will hopefully talk to patient regarding symptoms to  see Shock liver Metabolic acidosis CPR associated R PTX and rib fx   - Diagnostic therapeutic bronch then SBT/SAT - Try to avoid fevers - Recheck EKG and trop; if trop > 1k should consider LHC otherwise nonurgent  cards consult depending on GOC - Bicarb gtt x 1 L - Albumin 100g over 24h - Levo, vaso MAP 65, do not think needs inotropes at present - Check add on cortisol, stress steroids if < 10 - Pigtail to suction for now - Replete Mg - Likely needs MBSS +/- esophagram prior to DC - Sister updated at bedside  Best Practice (right click and "Reselect all SmartList Selections" daily)   Diet/type: NPO DVT prophylaxis: lovenox Pressure ulcer(s): N/A GI prophylaxis: H2B Lines: N/A Foley:  Yes, and it is still needed Code Status:  full code Last date of multidisciplinary goals of care discussion [3/29 DNR, will re address once off vent]  42 min cc time independent of procedures Mitchell Halsted MD PCCM

## 2023-12-14 NOTE — Progress Notes (Signed)
 12/14/2023 Failed SBT, let rest today and will try again tomorrow.  [name] MD PCCM

## 2023-12-14 NOTE — Procedures (Signed)
 Bronchoscopy Procedure Note  Mitchell Maldonado  [id number]  23-Mar-1966  Date:12/14/23  Time:10:34 AM   Provider Performing:Alleta Avery C Katrinka Blazing   Procedure(s):  Flexible bronchoscopy with bronchial alveolar lavage [phone]) and Initial Therapeutic Aspiration of Tracheobronchial Tree [phone])  Indication(s) Mucus plugging of bronchi  Consent Verbal from sister at bedside  Anesthesia Etomidate 20 x 1   Time Out Verified patient identification, verified procedure, site/side was marked, verified correct patient position, special equipment/implants available, medications/allergies/relevant history reviewed, required imaging and test results available.   Sterile Technique Usual hand hygiene, masks, gowns, and gloves were used   Procedure Description Bronchoscope advanced through endotracheal tube and into airway.   Thick brown secretions L>R suctioned partially obstructive BAL then performed in LLL with return of clear fluid + what appeared to be food particles  EBL Minimal   Specimen(s) BAL LLL

## 2023-12-15 ENCOUNTER — Inpatient Hospital Stay (HOSPITAL_COMMUNITY)

## 2023-12-15 DIAGNOSIS — J690 Pneumonitis due to inhalation of food and vomit: Secondary | ICD-10-CM

## 2023-12-15 DIAGNOSIS — E876 Hypokalemia: Secondary | ICD-10-CM

## 2023-12-15 DIAGNOSIS — E43 Unspecified severe protein-calorie malnutrition: Secondary | ICD-10-CM | POA: Insufficient documentation

## 2023-12-15 DIAGNOSIS — R627 Adult failure to thrive: Secondary | ICD-10-CM

## 2023-12-15 DIAGNOSIS — I5020 Unspecified systolic (congestive) heart failure: Secondary | ICD-10-CM

## 2023-12-15 DIAGNOSIS — J9601 Acute respiratory failure with hypoxia: Secondary | ICD-10-CM | POA: Diagnosis not present

## 2023-12-15 DIAGNOSIS — G7100 Muscular dystrophy, unspecified: Secondary | ICD-10-CM

## 2023-12-15 DIAGNOSIS — I469 Cardiac arrest, cause unspecified: Secondary | ICD-10-CM | POA: Diagnosis not present

## 2023-12-15 LAB — BASIC METABOLIC PANEL WITH GFR
Anion gap: 10 (ref 5–15)
Anion gap: 9 (ref 5–15)
BUN: 11 mg/dL (ref 6–20)
BUN: 11 mg/dL (ref 6–20)
CO2: 22 mmol/L (ref 22–32)
CO2: 22 mmol/L (ref 22–32)
Calcium: 7.2 mg/dL — ABNORMAL LOW (ref 8.9–10.3)
Calcium: 7.7 mg/dL — ABNORMAL LOW (ref 8.9–10.3)
Chloride: 101 mmol/L (ref 98–111)
Chloride: 103 mmol/L (ref 98–111)
Creatinine, Ser: 0.86 mg/dL (ref 0.61–1.24)
Creatinine, Ser: 0.87 mg/dL (ref 0.61–1.24)
GFR, Estimated: >60 mL/min (ref >60)
GFR, Estimated: >60 mL/min
Glucose, Bld: 134 mg/dL — ABNORMAL HIGH (ref 70–99)
Glucose, Bld: 109 mg/dL — ABNORMAL HIGH (ref 70–99)
Potassium: 3.3 mmol/L — ABNORMAL LOW (ref 3.5–5.1)
Potassium: 4.2 mmol/L (ref 3.5–5.1)
Sodium: 133 mmol/L — ABNORMAL LOW (ref 135–145)
Sodium: 134 mmol/L — ABNORMAL LOW (ref 135–145)

## 2023-12-15 LAB — CBC
HCT: 28.7 % — ABNORMAL LOW (ref 39.0–52.0)
Hemoglobin: 10.2 g/dL — ABNORMAL LOW (ref 13.0–17.0)
MCH: 29.2 pg (ref 26.0–34.0)
MCHC: 35.5 g/dL (ref 30.0–36.0)
MCV: 82.2 fL (ref 80.0–100.0)
Platelets: 98 10*3/uL — ABNORMAL LOW (ref 150–400)
RBC: 3.49 MIL/uL — ABNORMAL LOW (ref 4.22–5.81)
RDW: 13.5 % (ref 11.5–15.5)
WBC: 12 10*3/uL — ABNORMAL HIGH (ref 4.0–10.5)
nRBC: 0 % (ref 0.0–0.2)

## 2023-12-15 LAB — MAGNESIUM: Magnesium: 2 mg/dL (ref 1.7–2.4)

## 2023-12-15 LAB — HEPATIC FUNCTION PANEL
ALT: 50 U/L — ABNORMAL HIGH (ref 0–44)
AST: 35 U/L (ref 15–41)
Albumin: 3 g/dL — ABNORMAL LOW (ref 3.5–5.0)
Alkaline Phosphatase: 78 U/L (ref 38–126)
Bilirubin, Direct: 1.3 mg/dL — ABNORMAL HIGH (ref 0.0–0.2)
Indirect Bilirubin: 1.2 mg/dL — ABNORMAL HIGH (ref 0.3–0.9)
Total Bilirubin: 2.5 mg/dL — ABNORMAL HIGH (ref 0.0–1.2)
Total Protein: 5 g/dL — ABNORMAL LOW (ref 6.5–8.1)

## 2023-12-15 LAB — GLUCOSE, CAPILLARY
Glucose-Capillary: 114 mg/dL — ABNORMAL HIGH (ref 70–99)
Glucose-Capillary: 103 mg/dL — ABNORMAL HIGH (ref 70–99)

## 2023-12-15 LAB — PHOSPHORUS: Phosphorus: 3.3 mg/dL (ref 2.5–4.6)

## 2023-12-15 MED ORDER — DIPHENHYDRAMINE-ZINC ACETATE 2-0.1 % EX CREA
TOPICAL_CREAM | Freq: Three times a day (TID) | CUTANEOUS | Status: DC | PRN
Start: 2023-12-15 — End: 2023-12-15
  Administered 2023-12-15: 1 via TOPICAL
  Filled 2023-12-15: qty 28

## 2023-12-15 MED ORDER — LORAZEPAM 2 MG/ML IJ SOLN
2.0000 mg | INTRAMUSCULAR | Status: DC | PRN
  Administered 2023-12-15: 2 mg via INTRAVENOUS
  Filled 2023-12-15: qty 1

## 2023-12-15 MED ORDER — ORAL CARE MOUTH RINSE: 15.0000 mL | OROMUCOSAL | Status: DC | PRN

## 2023-12-15 MED ORDER — POTASSIUM CHLORIDE 10 MEQ/50ML IV SOLN
10.0000 meq | INTRAVENOUS | Status: AC
  Administered 2023-12-15 (×6): 10 meq via INTRAVENOUS
  Filled 2023-12-15 (×6): qty 50

## 2023-12-15 MED ORDER — SORBITOL 70 % SOLN
30.0000 mL | Freq: Once | Status: DC
Start: 2023-12-15 — End: 2023-12-15

## 2023-12-15 MED ORDER — ORAL CARE MOUTH RINSE
15.0000 mL | OROMUCOSAL | Status: DC
  Administered 2023-12-15: 15 mL via OROMUCOSAL

## 2023-12-15 MED ORDER — MORPHINE SULFATE (PF) 2 MG/ML IV SOLN
INTRAVENOUS | Status: AC
  Filled 2023-12-15: qty 1

## 2023-12-15 MED ORDER — MORPHINE SULFATE (PF) 2 MG/ML IV SOLN
2.0000 mg | INTRAVENOUS | Status: DC | PRN
Start: 2023-12-15 — End: 2023-12-16
  Administered 2023-12-15: 4 mg via INTRAVENOUS
  Filled 2023-12-15: qty 2

## 2023-12-15 MED ORDER — MORPHINE SULFATE (PF) 2 MG/ML IV SOLN
2.0000 mg | INTRAVENOUS | Status: DC | PRN
  Administered 2023-12-15: 2 mg via INTRAVENOUS

## 2023-12-15 MED ORDER — MIDODRINE HCL 5 MG PO TABS: 10.0000 mg | ORAL_TABLET | Freq: Three times a day (TID) | ORAL | Status: DC

## 2023-12-16 LAB — CULTURE, RESPIRATORY W GRAM STAIN: Culture: NORMAL

## 2023-12-16 NOTE — IPAL (Signed)
  Interdisciplinary Goals of Care Family Meeting   Date carried out:   Location of the meeting: Bedside  Member's involved: Nurse Practitioner, Bedside Registered Nurse, Family Member or next of kin, and Other: the patient Mitchell Maldonado and his sister who was at bedside Armond Hang Power of Attorney or acting medical decision maker: the patient and if unable his sister Mitchell Maldonado     Discussion: We discussed goals of care for Mitchell Maldonado .  Mitchell Titsworth was extubated earlier today directly to BIPAP. After time to bridge from intubation to NIPPV we attempted to take him off BIPAP. We were unable to keep sats above 70s even on NRB. We placed him back on NIPPV to rest for 2 hours in effort to discuss goals of care. This included specifically 1) going back on vent which almost certainly would lead to possible trach -->he was clear this was NOT a path he wished to take. 2) we discussed trial of on and off BIPAP to see if with time and treating his PNA his endurance might improve. He indicated he did not want the BIPAP but also indicated he wanted to try to "fight it". His sats in spite of 50 lpm and 100% on heated high flow dropped to 81%. His sister was also at bedside and we all agreed that at this point we were concerned about his ability to process further advanced directives. He kept asking "so you're going to put me to sleep?" And at one point specifically asked for this but this was right after saying he wanted to fight. Because if the increased SOB and desaturation we elected to place him back on NIPPV with plan to try again to cycle off later. We were clear to him that after his saturations improved and should he still desire to not use the mask we would be dealing with the end of his life.   Code status:   Code Status: Do not attempt resuscitation (DNR) no CPR, No intubation, no escalation beyond alternating NIPPV and Heated high flow   Disposition: Continue current acute  care  Time spent for the meeting: 22     Shelby Mattocks, NP  , 4:26 PM

## 2023-12-16 NOTE — Progress Notes (Addendum)
 Initial Nutrition Assessment  DOCUMENTATION CODES:  Severe malnutrition in context of social or environmental circumstances  INTERVENTION:  Once enteral access obtained, TF recommendations: Start Vital 1.5 at 81ml/hr and advance by 10ml q8h to goal rate of 71ml/hr ( per day) 60ml ProSource TF20 once daily Provides 2060 kcal, 109g protein and 1008 ml free water  Monitor magnesium and phosphorus every 12 hours x 4 occurrences, MD to replete as needed, as pt is at risk for refeeding syndrome d/t ~6 days of inadequate PO intake  Recommend thiamine 100mg  daily x5 days  NUTRITION DIAGNOSIS:   Severe Malnutrition related to social / environmental circumstances as evidenced by severe fat depletion, severe muscle depletion.  GOAL:   Patient will meet greater than or equal to 90% of their needs  MONITOR:   Vent status, Labs, Weight trends, I & O's  REASON FOR ASSESSMENT:   Consult, Ventilator Enteral/tube feeding initiation and management  ASSESSMENT:   Pt admitted with c/o 3-4 days on-going nausea, vomiting and progressive weakness; developed worsening respiratory failure on admission leading to PEA and then wide-complex tachycardia. PMH significant for tobacco use and myotonic dystrophy.  3/29- admitted, PEA, developed wide-complex tachycardia; intubated 3/30- s/p bronchoscopy d/t mucus plugging; failed SBT  Failed SBT yesterday. Plans to re-try today.  Once extubated noted plans for SLP evaluation d/t concern for chronic aspiration.   IPAL conversation reflects pt does not seek medical care regularly. Noted his sister's report of a decline in his quality of life over the last year c/b psychiatric disease. Continue with current interventions and monitor progress.   Patient is currently intubated on ventilator support MV: 7.3 L/min Temp (24hrs), Avg:98.5 F (36.9 C), Min:97.3 F (36.3 C), Max:100.6 F (38.1 C) MAP (a-line):  75  Noted pt with hiatal hernia and  difficulty obtaining enteral access. Plan was for Cortrak today however d/t difficult placement will likely need placed with radiology vs EGD placement, pt currently declining.    Unable to assess for recent weight changes d/t limited documentation on file.   Drains/lines: UOP: x24 hours R chest tube: 90ml x24 hours  Medications: miralax, senna BID, sorbitol Drips: Abx Levo @ 23mcg/min Potassium chloride Vaso off  Labs:  Sodium 133 Potassium 3.3 Corrected calcium 8 ALT 50 CBG's 96-141 x24 hours  NUTRITION - FOCUSED PHYSICAL EXAM: Patient meets criteria for severe malnutrition. Suspect this could be related to underlying chronic illnesses however given report of limited medical treatment it is more likely his malnutrition is in the setting of social/environmental factors.  Flowsheet Row Most Recent Value  Orbital Region Severe depletion  Upper Arm Region Moderate depletion  Thoracic and Lumbar Region Moderate depletion  Buccal Region Severe depletion  Temple Region Severe depletion  Clavicle Bone Region Moderate depletion  Clavicle and Acromion Bone Region Moderate depletion  Scapular Bone Region Severe depletion  Dorsal Hand Unable to assess  Patellar Region Severe depletion  Anterior Thigh Region Severe depletion  Posterior Calf Region Severe depletion  Edema (RD Assessment) None   Diet Order:   Diet Order             Diet NPO time specified  Diet effective now                   EDUCATION NEEDS:   Not appropriate for education at this time  Skin:  Skin Assessment: Reviewed RN Assessment  Last BM:  PTA  Height:   Ht Readings from Last 1 Encounters:  12/13/23 5\' 8"  (  1.727 m)    Weight:   Wt Readings from Last 1 Encounters:   70.3 kg   BMI:  Body mass index is 23.57 kg/m.  Estimated Nutritional Needs:   Kcal:  1900-2100  Protein:  95-110g  Fluid:  >/=1.9L  Drusilla Kanner, RDN, LDN Clinical Nutrition See AMiON for contact  information.

## 2023-12-16 NOTE — Progress Notes (Signed)
 Matt had been resting on NIPPV.  He has requested that we no longer use NIPPV and after brief discussion and talking to his parents on phone he later decided that he does not want to use the heated high flow and has asked [place] to transition to full comfort care.  He removed the heated high flow His pulse ox rapidly dropped he did become restless and diaphoretic. We urgently administered 2mg  IV and then subsequently another 4mg  of morphine and 2mg  ativan gor increased discomfort (pulse ox currently 40s)  Sister at bed side.  Anticipate he will not last more than an hour  Transitioning to full comfort.

## 2023-12-16 NOTE — Progress Notes (Signed)
 Has completed SBT Ve acceptable w/ acceptable F/Vt <105 Not sure more time will make this better Plan Extubate to BIPAP Need to facilitate GOC discussion when able

## 2023-12-16 NOTE — Progress Notes (Signed)
 Cortrak Tube Team Note:  Consult received to place a Cortrak feeding tube. Attempted NG/OG placements have been unsuccessful.   Cortrak Team attempted placement but unable to advance Cortrak past 40 cm, unable to pass beyond GE junction. 2 Cortak RDs attempted.  Discussed with Dr. Katrinka Blazing; plan for Radiology vs EGD placement attempts  If the tube becomes dislodged please keep the tube and contact the Cortrak team at [email or web address] for replacement.  If after hours and replacement cannot be delayed, place a NG tube and confirm placement with an abdominal x-ray.   Romelle Starcher MS, RDN, LDN, CNSC Registered Dietitian 3 Clinical Nutrition RD Inpatient Contact Info in Amion

## 2023-12-16 NOTE — Progress Notes (Signed)
 NAME:  Mitchell Maldonado, MRN:  [id number], DOB:  01/06/1966, LOS: 2 ADMISSION DATE:  12/13/2023, CONSULTATION DATE: 12/13/2023 REFERRING MD: Dr. Elayne Snare, CHIEF COMPLAINT: Cardiopulmonary arrest  History of Present Illness:  58 year old man, history of tobacco use, myotonic dystrophy (confirmed by EMG).  He presented to the ED on 3/29 with 3 to 4 days of ongoing nausea vomiting and progressive weakness.  He called EMS on 3/29 and was noted to be hypoxemic, SpO2 80's% on RA.  Initially improved with nasal cannula.  He did deny fevers, chills, abdominal pain.  He had minimal p.o. intake for 4 days.  Influenza, COVID-19 and RSV all negative Required escalation of his FiO2, ABG with respiratory acidosis and hypoxemia.  Other labs significant for hypokalemia and mild transaminitis.  He is involved progressive respiratory failure, ultimately pulselessness.  CPR initiated, initially PEA and then wide-complex tachycardia from which he was cardioverted.  Amiodarone bolus and infusion initiated.  Also received IV magnesium.  Endotracheally intubated and stability achieved.  Postintubation chest x-ray with bilateral patchy airspace opacities particularly on the left, less prominent in the right midlung consistent with pneumonia.   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  No mediastinal or hilar adenopathy.  Apparent mucus or debris in the left mainstem bronchus and multiple left lower lobe bronchi with left lower lobe consolidation.  Extensive centrilobular groundglass nodularity in the left upper lobe, right midlung.  2 local areas of loculated air in the right midlung with an acute nondisplaced right  lateral fourth rib fracture. CT abdomen/pelvis 3/29 >> stable cholelithiasis, otherwise normal 3/30 bronchoscopy 3/31 weaning lethargic  Interim History / Subjective:  Pressor requirements improved  Objective   Blood pressure (!) 74/61, pulse 62, temperature 97.7 F (36.5 C), resp. rate 18, height 5\' 8"  (1.727 m), weight 70.3 kg, SpO2 98%. CVP:  [9 mmHg-14 mmHg] 11 mmHg  Vent Mode: PRVC FiO2 (%):  [40 %] 40 % Set Rate:  [18 bmp] 18 bmp Vt Set:  [550 mL] 550 mL PEEP:  [5 cmH20] 5 cmH20 Plateau Pressure:  [19 cmH20] 19 cmH20   Intake/Output Summary (Last 24 hours) at  0751 Last data filed at  0700 Gross per 24 hour  Intake 4776.43 ml  Output 790 ml  Net 3986.43 ml   Filed Weights   12/13/23 1030 12/14/23 0248  0500  Weight: 75 kg 64.4 kg 70.3 kg    Examination: General Chronically ill-appearing 58 year old male patient currently on pressure support ventilation lethargic but no distress HEENT normocephalic atraumatic orally intubated Pulmonary bilateral rales, no accessory use but tidal volumes in the 300s on pressure support, the right chest tube is in place, there is no airleak Portable chest x-ray personally reviewed:The endotracheal tube is in place tip is approximately 2 cm above carina there is patchy bilateral airspace disease a little worse on the right today when comparing film from day prior.  I do not see pneumothorax Cardiac regular rate and rhythm Abdomen soft nontender Extremities warm dry Neuro opens eyes, interacts, moves all extremities but profoundly weak GU clear yellow  Resolved Hospital Problem list   PEA cardiac arrest Metabolic acidosis resolved  Assessment & Plan:   Acute  hypoxemic respiratory failure-secondary to progressive muscular dystrophy, failure to thrive, and aspiration pneumonia Almost certainly has chronic aspiration  All culture data still pending plan Day 3 doxycycline and Zosyn planning for 5 to 7-day  course Continuing full ventilator support with daily assessment for SBT, when extubated we will need to extubate him to BiPAP He is almost certainly going to need BiPAP at night and as needed Scheduled bronchodilators We are going to need to have an extensive goals of care discussion, should he fail extubation, or not want to use NIPPV or not tolerate NIPPV choices would be trach plus minus vent versus palliative care VAP bundle RASS goal 0  CPR associated R PTX and rib fx  Had 1 out of 7 airleak on 3/30, no airleak today and no pneumo on chest x-ray Plan Continuing chest tube to suction, we may be able to remove this tomorrow  Hiatal hernia with difficulty passing nasogastric tube, and history of reflux/aspiration Plan Will need esophagram  Placing core track for now  Mixed septic/cardiogenic shock post arrest- initial trop neg, new LBBB; still suspect most likely Type II.  Troponins trending down Cortisol level 26  plan Continue telemetry monitoring Weaning norepinephrine for SBP >90 Add midodrine via tube Holding antihypertensives  HFrEF w/ ejection fraction of 25%, also moderately reduced RV function  PEA followed by VT arrest  Plan Cont amiodarone gtt Continue telemetry monitoring Hemodynamics prevent GDMT  Fluid and electrolyte imbalance: Hypokalemia Plan Replace and recheck as indicated  Severe protein calorie malnutrition POA Plan Place Cor track   Shock liver improving Plan Monitor   Best Practice (right click and "Reselect all SmartList Selections" daily)   Diet/type: NPO DVT prophylaxis: lovenox Pressure ulcer(s): N/A GI prophylaxis: H2B Lines: N/A Foley:  Yes, and it is still needed Code Status:  full code Last date of multidisciplinary goals of care discussion [3/29 DNR, will re address once off vent]  My cct 32 min  Simonne Martinet ACNP-BC [facility] Pulmonary/Critical Care Pager # [phone] OR # [phone] if no answer

## 2023-12-16 NOTE — Progress Notes (Signed)
 Extubated to NIPPV Required titration of IPAP to 16cmH2O.  VTs low 400s. Appears comfortable  Does have airleak from mask Will watch closely.  I am concerned that he may not have the endurance to stay off vent

## 2023-12-16 DEATH — deceased

## 2023-12-18 LAB — CULTURE, BLOOD (ROUTINE X 2)
Culture: NO GROWTH
Culture: NO GROWTH

## 2024-01-15 NOTE — Death Summary Note (Signed)
 DEATH SUMMARY   Patient Details  Name: Mitchell Maldonado MRN: [id number] DOB: 1965-10-20  Admission/Discharge Information   Admit Date:  2023-12-14  Date of Death: Date of Death: 2023-12-16  Time of Death: Time of Death: 12-22-26  Length of Stay: 2  Referring Physician: Patient, No Pcp Per     Diagnoses  Preliminary cause of death: myotonic dystrophy x 5 years Secondary Diagnoses (including complications and co-morbidities):  Cardiac arrest secondary to aspiration Acute hypoxemic respiratory failure secondary to aspiration pneumonia Severe protein calorie malnutrition Dysphagia some combination anatomical issues vs. 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  He presented to the ED on 14-Dec-2023 with 3 to 4 days of ongoing nausea vomiting and progressive weakness.  He called EMS on 12/14/2023 and was noted to be hypoxemic, SpO2 80's% on RA.  Initially improved with nasal cannula.  He did deny fevers, chills, abdominal pain.  He had minimal p.o. intake for 4 days.  Influenza, COVID-19 and RSV all negative Required escalation of his FiO2, ABG with respiratory acidosis and hypoxemia.  Other labs significant for hypokalemia and mild transaminitis.  He is involved progressive respiratory failure, ultimately pulselessness.  CPR initiated, initially PEA and then wide-complex tachycardia from which he was cardioverted.  Amiodarone bolus and infusion initiated.  Also received IV magnesium.   Endotracheally intubated and stability achieved.  Postintubation chest x-ray with bilateral patchy airspace opacities particularly on the left, less prominent in the right midlung consistent with pneumonia.   Patient was stabilized on ventilator and eventually extubated to BIPAP. He remained BIPAP dependent and discussions held with him and his sister regarding quality of life.  Patient has been nearly bedbound, losing weight, and does not want aggressive medical care of any sort.  In light of this, he wanted to transition to comfort care and he passed away peacefully off BIPAP.  Pertinent Labs and Studies  Significant Diagnostic Studies DG Chest Port 1 View Result Date: Dec 16, 2023 CLINICAL DATA:  Pneumothorax. EXAM: PORTABLE CHEST 1 VIEW COMPARISON:  12/14/2023 FINDINGS: Endotracheal tube tip is approximately 2.3 cm above the base of the carina. Tiny right apical pneumothorax has decreased slightly in the interval. Right pigtail drainage catheter is presumably in the pleural space. There is adjacent soft tissue gas, increased in the interval. The retrocardiac left base collapse/consolidation appears progressive with patchy airspace disease seen in both mid and lower lungs. Right subclavian central line tip overlies the right atrium. Telemetry leads overlie the chest. IMPRESSION: 1. Tiny right apical pneumothorax has decreased slightly in the interval. 2. Progressive retrocardiac left base collapse/consolidation with patchy airspace disease in both mid and lower lungs, similar to prior. Electronically Signed   By: Kennith Center M.D.   On: 2023-12-16 09:21   DG Chest Port 1 View Result Date: 12/14/2023 CLINICAL DATA:  Pneumothorax. EXAM: PORTABLE CHEST 1 VIEW COMPARISON:  Radiographs Dec 14, 2023.  CT 2023/12/14. FINDINGS: 0903 hours. Tip of the endotracheal tube is 2.4 cm above the carina. Enteric tube has been removed in the interval. Right subclavian Port-A-Cath appears unchanged at the level of the mid  right atrium. The heart size and mediastinal contours are stable. Small caliber pigtail catheter projects over the right costophrenic angle. Minimal right apical pneumothorax appears improved. Unchanged patchy left greater than right airspace opacities and left lower lobe consolidation. No significant pleural effusion. IMPRESSION: 1. Interval removal of enteric tube. 2. Improved right apical pneumothorax with small caliber pigtail catheter in place. 3. Unchanged patchy left greater than right airspace opacities and left lower lobe consolidation. Electronically Signed   By: Carey Bullocks M.D.   On: 12/14/2023 09:15   DG Abd 1 View Result Date: 12/13/2023 CLINICAL DATA:  Enteric tube adjustment. EXAM: ABDOMEN - 1 VIEW COMPARISON:  Abdominal x-rays from earlier today. FINDINGS: Compared to the prior study, the enteric tube is not changed position. Side port remains near the GE junction. Examination is otherwise unchanged. IMPRESSION: 1. No change in positioning of the enteric tube. Electronically Signed   By: Obie Dredge M.D.   On: 12/13/2023 17:05   DG Abd 1 View Result Date: 12/13/2023 CLINICAL DATA:  Orogastric tube adjustment. EXAM: ABDOMEN - 1 VIEW COMPARISON:  Radiographs earlier the same date. Abdominal CT 12/13/2023. FINDINGS: 1534 hours. Semi erect portable examination centered on the diaphragm demonstrates no change in the position of the enteric tube. The tip of the tube overlies the left upper quadrant of the abdomen, and the side hole is near the GE junction. The visualized bowel gas pattern is normal. Right subclavian central venous catheter projects over the mid right atrium. Endotracheal tube in place. IMPRESSION: No change in the position of the enteric tube. The side hole remains near the GE junction. Electronically Signed   By: Carey Bullocks M.D.   On: 12/13/2023 16:10   ECHOCARDIOGRAM COMPLETE Result Date: 12/13/2023    ECHOCARDIOGRAM REPORT   Patient Name:   Mitchell Maldonado Date of  Exam: 12/13/2023 Medical Rec #:  [id number]     Height:       68.0 in Accession #:    [id number]    Weight:       165.3 lb Date of Birth:  1966/09/10     BSA:          1.885 m Patient Age:    57 years      BP:           81/64 mmHg Patient Gender: M             HR:           111 bpm. Exam Location:  Inpatient Procedure: 2D Echo, Cardiac Doppler, Color Doppler and Intracardiac            Opacification Agent (Both Spectral and Color Flow Doppler were            utilized during procedure). Indications:    Cardiac Arrest I46.9  History:        Patient has no prior history of Echocardiogram examinations.  Sonographer:    Harriette Bouillon RDCS Referring Phys: [id number] ROBERT S BYRUM  Sonographer Comments: Technically difficult study due to poor echo windows. IMPRESSIONS  1. Left ventricular ejection fraction, by estimation, is 25% with significant dyssynchrony with LBBB and PVCs. The left ventricle has severely decreased function. Left ventricular endocardial border not optimally defined to evaluate regional wall motion. Left ventricular diastolic parameters are indeterminate.  2. Right ventricular systolic function is moderately reduced. The right ventricular size is normal. Tricuspid regurgitation signal is inadequate for assessing PA pressure.  3. The mitral valve is grossly normal. Trivial  mitral valve regurgitation.  4. The aortic valve was not well visualized. Aortic valve regurgitation is not visualized. FINDINGS  Left Ventricle: Left ventricular ejection fraction, by estimation, is 25%. The left ventricle has severely decreased function. Left ventricular endocardial border not optimally defined to evaluate regional wall motion. Definity contrast agent was given IV to delineate the left ventricular endocardial borders. The left ventricular internal cavity size was normal in size. There is borderline left ventricular hypertrophy. Abnormal (paradoxical) septal motion, consistent with left bundle branch block. Left  ventricular  diastolic parameters are indeterminate. Right Ventricle: The right ventricular size is normal. Right vetricular wall thickness was not well visualized. Right ventricular systolic function is moderately reduced. Tricuspid regurgitation signal is inadequate for assessing PA pressure. Left Atrium: Left atrial size was normal in size. Right Atrium: Right atrial size was normal in size. Pericardium: There is no evidence of pericardial effusion. Mitral Valve: The mitral valve is grossly normal. Trivial mitral valve regurgitation. Tricuspid Valve: The tricuspid valve is grossly normal. Tricuspid valve regurgitation is trivial. Aortic Valve: The aortic valve was not well visualized. Aortic valve regurgitation is not visualized. Pulmonic Valve: The pulmonic valve was not well visualized. Pulmonic valve regurgitation is trivial. Aorta: The aortic root is normal in size and structure. Venous: The inferior vena cava was not well visualized. IAS/Shunts: The interatrial septum was not well visualized.  LEFT VENTRICLE PLAX 2D LVIDd:         4.60 cm   Diastology LVIDs:         3.80 cm   LV e' lateral: 6.74 cm/s LV PW:         1.00 cm LV IVS:        1.00 cm LVOT diam:     2.10 cm LV SV:         16 LV SV Index:   8 LVOT Area:     3.46 cm  LEFT ATRIUM         Index LA diam:    2.30 cm 1.22 cm/m  AORTIC VALVE LVOT Vmax:   46.60 cm/s LVOT Vmean:  28.300 cm/s LVOT VTI:    0.045 m  AORTA Ao Root diam: 3.10 cm Ao Asc diam:  2.80 cm  SHUNTS Systemic VTI:  0.05 m Systemic Diam: 2.10 cm Weston Brass MD Electronically signed by Weston Brass MD Signature Date/Time: 12/13/2023/3:09:46 PM    Final    DG Abd 1 View Result Date: 12/13/2023 CLINICAL DATA:  Enteric tube placement. EXAM: ABDOMEN - 1 VIEW COMPARISON:  Chest x-rays from earlier today. FINDINGS: Interval advancement the enteric tube with the tip now in the stomach and the side port at the GE junction. The visualized bowel gas pattern is normal. Unchanged chest tube at the  peripheral right lung base. Unchanged patchy airspace disease in the visualized left lung. IMPRESSION: 1. Interval advancement of the enteric tube with the tip now in the stomach and the side port at the GE junction. Recommend advancement by 4 cm. Electronically Signed   By: Obie Dredge M.D.   On: 12/13/2023 14:54   DG CHEST PORT 1 VIEW Result Date: 12/13/2023 CLINICAL DATA:  [id number] Encounter for central line placement [id number] EXAM: PORTABLE CHEST 1 VIEW COMPARISON:  December 13, 2023 FINDINGS: The cardiomediastinal silhouette is unchanged in contour.RIGHT chest CVC tip terminates over the RIGHT atrium. Enteric tube side port projects over the esophagus with the tip projecting over the distal esophagus. ETT tip terminates 2.7 cm above the carina. There is  a small RIGHT pneumothorax with RIGHT chest tube in place. Patchy bilateral LEFT greater than RIGHT airspace opacities with heterogeneous opacification of the LEFT lower lobe, similar comparison to prior IMPRESSION: 1. RIGHT chest CVC tip terminates over the RIGHT atrium. 2. Enteric tube side port projects over the esophagus. Tip projects over the distal esophagus. 3. Small RIGHT pneumothorax with RIGHT chest tube in place. 4. Similar appearance of patchy bilateral airspace opacities. Electronically Signed   By: Meda Klinefelter M.D.   On: 12/13/2023 14:00   CT Head Wo Contrast Result Date: 12/13/2023 CLINICAL DATA:  Status post arrest EXAM: CT HEAD WITHOUT CONTRAST TECHNIQUE: Contiguous axial images were obtained from the base of the skull through the vertex without intravenous contrast. RADIATION DOSE REDUCTION: This exam was performed according to the departmental dose-optimization program which includes automated exposure control, adjustment of the mA and/or kV according to patient size and/or use of iterative reconstruction technique. COMPARISON:  None Available. FINDINGS: Brain: Patchy low-density changes within the periventricular and subcortical  white matter. No definite changes of cerebral edema. No mass effect or midline shift. No large territory acute infarction. No intracranial hemorrhage or extra-axial collection. No hydrocephalus. Vascular: No hyperdense vessel or unexpected calcification. Skull: Normal. Negative for fracture or focal lesion. Sinuses/Orbits: No acute finding. Other: None. IMPRESSION: 1. Patchy low-density changes within the periventricular and subcortical white matter, which are nonspecific but may represent sequela of chronic microvascular ischemic disease. No definite changes of cerebral edema. If there is clinical concern for anoxic brain injury, MRI of the brain is recommended. 2. No intracranial hemorrhage. Electronically Signed   By: Duanne Guess D.O.   On: 12/13/2023 12:15   CT Angio Chest Pulmonary Embolism (PE) W or WO Contrast Result Date: 12/13/2023 CLINICAL DATA:  Shortness of breath and vomiting for the past 3 days. Respiratory arrest. EXAM: CT ANGIOGRAPHY CHEST CT ABDOMEN AND PELVIS WITH CONTRAST TECHNIQUE: Multidetector CT imaging of the chest was performed using the standard protocol during bolus administration of intravenous contrast. Multiplanar CT image reconstructions and MIPs were obtained to evaluate the vascular anatomy. Multidetector CT imaging of the abdomen and pelvis was performed using the standard protocol during bolus administration of intravenous contrast. RADIATION DOSE REDUCTION: This exam was performed according to the departmental dose-optimization program which includes automated exposure control, adjustment of the mA and/or kV according to patient size and/or use of iterative reconstruction technique. CONTRAST:  75mL OMNIPAQUE IOHEXOL 350 MG/ML SOLN COMPARISON:  Chest x-rays from same day. CTA chest abdomen, and pelvis dated Jan 27, 2021. FINDINGS: CTA CHEST FINDINGS Cardiovascular: Satisfactory opacification of the pulmonary arteries to the segmental level. No evidence of pulmonary  embolism. Normal heart size. No pericardial effusion. No evidence of thoracic aortic aneurysm or dissection. Mediastinum/Nodes: No enlarged mediastinal, hilar, or axillary lymph nodes. Patulous esophagus with debris distally. Enteric tube tip at the GE junction. Normal thyroid gland. Lungs/Pleura: Endotracheal tube tip 1.8 cm above the carina. Debris within the left mainstem bronchus and multiple left lower lobe bronchi. Near complete collapse of the left lower lobe with areas of consolidation. Extensive ill-defined centrilobular ground-glass nodules throughout the left upper lobe with patchy areas of consolidation posteriorly. There also ill-defined centrilobular ground-glass nodules throughout the right lung, but to a milder degree. There are two areas of loculated pleural air in the right lung, the first along the major fissure measuring 1.9 cm AP x 6.3 cm TR in the second inferior to the right middle lobe, measuring 1.6 cm AP x 3.4  cm TR (series 4, images 90 and 123). No pleural effusion. Musculoskeletal: No chest wall abnormality. Acute nondisplaced fracture of the right lateral fourth rib. Review of the MIP images confirms the above findings. CT ABDOMEN PELVIS FINDINGS Hepatobiliary: No focal liver abnormality is seen. Multiple gallstones again noted. No gallbladder wall thickening or biliary dilatation. Pancreas: Unremarkable. No pancreatic ductal dilatation or surrounding inflammatory changes. Spleen: Normal in size without focal abnormality. Adrenals/Urinary Tract: Adrenal glands are unremarkable. Kidneys are normal, without renal calculi, solid lesion, or hydronephrosis. Bladder is unremarkable. Stomach/Bowel: Stomach is within normal limits. Appendix appears normal. No evidence of bowel wall thickening, distention, or inflammatory changes. Vascular/Lymphatic: No significant vascular findings are present. No enlarged abdominal or pelvic lymph nodes. Reproductive: Prostate is unremarkable. Other: No  abdominal wall hernia or abnormality. No abdominopelvic ascites. No pneumoperitoneum. Musculoskeletal: No acute or significant osseous findings. Review of the MIP images confirms the above findings. IMPRESSION: Chest: 1. No evidence of pulmonary embolism. 2. Debris within the left mainstem bronchus and multiple left lower lobe bronchi with near complete collapse of the left lower lobe and extensive multifocal aspiration pneumonia, worse in the left lung. 3. Two overall small areas of loculated pleural air in the right lung, possibly due to barotrauma. 4. Acute nondisplaced fracture of the right lateral fourth rib. 5. Enteric tube tip at the GE junction. Recommend advancement. Abdomen and pelvis: 1. No acute intra-abdominal process. 2. Unchanged cholelithiasis. Electronically Signed   By: Obie Dredge M.D.   On: 12/13/2023 11:43   CT ABDOMEN PELVIS W CONTRAST Result Date: 12/13/2023 CLINICAL DATA:  Shortness of breath and vomiting for the past 3 days. Respiratory arrest. EXAM: CT ANGIOGRAPHY CHEST CT ABDOMEN AND PELVIS WITH CONTRAST TECHNIQUE: Multidetector CT imaging of the chest was performed using the standard protocol during bolus administration of intravenous contrast. Multiplanar CT image reconstructions and MIPs were obtained to evaluate the vascular anatomy. Multidetector CT imaging of the abdomen and pelvis was performed using the standard protocol during bolus administration of intravenous contrast. RADIATION DOSE REDUCTION: This exam was performed according to the departmental dose-optimization program which includes automated exposure control, adjustment of the mA and/or kV according to patient size and/or use of iterative reconstruction technique. CONTRAST:  75mL OMNIPAQUE IOHEXOL 350 MG/ML SOLN COMPARISON:  Chest x-rays from same day. CTA chest abdomen, and pelvis dated Jan 27, 2021. FINDINGS: CTA CHEST FINDINGS Cardiovascular: Satisfactory opacification of the pulmonary arteries to the segmental  level. No evidence of pulmonary embolism. Normal heart size. No pericardial effusion. No evidence of thoracic aortic aneurysm or dissection. Mediastinum/Nodes: No enlarged mediastinal, hilar, or axillary lymph nodes. Patulous esophagus with debris distally. Enteric tube tip at the GE junction. Normal thyroid gland. Lungs/Pleura: Endotracheal tube tip 1.8 cm above the carina. Debris within the left mainstem bronchus and multiple left lower lobe bronchi. Near complete collapse of the left lower lobe with areas of consolidation. Extensive ill-defined centrilobular ground-glass nodules throughout the left upper lobe with patchy areas of consolidation posteriorly. There also ill-defined centrilobular ground-glass nodules throughout the right lung, but to a milder degree. There are two areas of loculated pleural air in the right lung, the first along the major fissure measuring 1.9 cm AP x 6.3 cm TR in the second inferior to the right middle lobe, measuring 1.6 cm AP x 3.4 cm TR (series 4, images 90 and 123). No pleural effusion. Musculoskeletal: No chest wall abnormality. Acute nondisplaced fracture of the right lateral fourth rib. Review of the MIP images confirms  the above findings. CT ABDOMEN PELVIS FINDINGS Hepatobiliary: No focal liver abnormality is seen. Multiple gallstones again noted. No gallbladder wall thickening or biliary dilatation. Pancreas: Unremarkable. No pancreatic ductal dilatation or surrounding inflammatory changes. Spleen: Normal in size without focal abnormality. Adrenals/Urinary Tract: Adrenal glands are unremarkable. Kidneys are normal, without renal calculi, solid lesion, or hydronephrosis. Bladder is unremarkable. Stomach/Bowel: Stomach is within normal limits. Appendix appears normal. No evidence of bowel wall thickening, distention, or inflammatory changes. Vascular/Lymphatic: No significant vascular findings are present. No enlarged abdominal or pelvic lymph nodes. Reproductive: Prostate is  unremarkable. Other: No abdominal wall hernia or abnormality. No abdominopelvic ascites. No pneumoperitoneum. Musculoskeletal: No acute or significant osseous findings. Review of the MIP images confirms the above findings. IMPRESSION: Chest: 1. No evidence of pulmonary embolism. 2. Debris within the left mainstem bronchus and multiple left lower lobe bronchi with near complete collapse of the left lower lobe and extensive multifocal aspiration pneumonia, worse in the left lung. 3. Two overall small areas of loculated pleural air in the right lung, possibly due to barotrauma. 4. Acute nondisplaced fracture of the right lateral fourth rib. 5. Enteric tube tip at the GE junction. Recommend advancement. Abdomen and pelvis: 1. No acute intra-abdominal process. 2. Unchanged cholelithiasis. Electronically Signed   By: Obie Dredge M.D.   On: 12/13/2023 11:43   DG Chest Portable 1 View Result Date: 12/13/2023 CLINICAL DATA:  Endotracheal tube adjustment. EXAM: PORTABLE CHEST 1 VIEW COMPARISON:  Chest x-ray from same day at 1037 hours. FINDINGS: Interval retraction of the endotracheal tube with the tip now 1.5 cm above the carina. Unchanged enteric tube with the tip near the GE junction. Stable cardiomediastinal silhouette. Similar patchy airspace opacities throughout the left upper lobe. Unchanged collapse/consolidation of the left lower lobe. Few subtle patchy opacities in the right mid and lower lung. No pneumothorax or large pleural effusion. No acute osseous abnormality. IMPRESSION: 1. Interval retraction of the endotracheal tube with the tip now 1.5 cm above the carina. 2. Unchanged enteric tube with the tip near the GE junction. Recommend advancement. 3. Similar multifocal aspiration pneumonia. Electronically Signed   By: Obie Dredge M.D.   On: 12/13/2023 11:25   DG Chest Portable 1 View Result Date: 12/13/2023 CLINICAL DATA:  Status post intubation. EXAM: PORTABLE CHEST 1 VIEW COMPARISON:  Chest x-ray  from same day at 0907 hours. FINDINGS: Interval intubation with endotracheal tube tip in the right mainstem bronchus. New enteric tube with the tip near the GE junction. Stable cardiomediastinal silhouette. New patchy consolidation in the left mid lung. Worsening aeration of the left lower lobe. No pneumothorax or large pleural effusion. No acute osseous abnormality. IMPRESSION: 1. Endotracheal tube tip in the right mainstem bronchus. At the time of this dictation, the tube has already been retracted on subsequent follow-up chest x-ray. 2. New enteric tube with the tip near the GE junction. Recommend advancement. 3. New patchy consolidation in the left mid lung, concerning for aspiration. Worsening aeration of the left lower lobe may in part be related to right mainstem intubation. Electronically Signed   By: Obie Dredge M.D.   On: 12/13/2023 11:20   DG Chest Portable 1 View Result Date: 12/13/2023 CLINICAL DATA:  Shortness of breath EXAM: PORTABLE CHEST 1 VIEW COMPARISON:  01/27/2021 FINDINGS: The heart size and mediastinal contours are within normal limits. Low lung volumes. Streaky bibasilar opacities. No pleural effusion or pneumothorax. The visualized skeletal structures are unremarkable. IMPRESSION: Low lung volumes with streaky bibasilar opacities, likely  atelectasis. Electronically Signed   By: Duanne Guess D.O.   On: 12/13/2023 09:24    Microbiology Recent Results (from the past 240 hours)  Resp panel by RT-PCR (RSV, Flu A&B, Covid) Anterior Nasal Swab     Status: None   Collection Time: 12/13/23  8:09 AM   Specimen: Anterior Nasal Swab  Result Value Ref Range Status   SARS Coronavirus 2 by RT PCR NEGATIVE NEGATIVE Final   Influenza A by PCR NEGATIVE NEGATIVE Final   Influenza B by PCR NEGATIVE NEGATIVE Final    Comment: (NOTE) The Xpert Xpress SARS-CoV-2/FLU/RSV plus assay is intended as an aid in the diagnosis of influenza from Nasopharyngeal swab specimens and should not be used  as a sole basis for treatment. Nasal washings and aspirates are unacceptable for Xpert Xpress SARS-CoV-2/FLU/RSV testing.  Fact Sheet for Patients: BloggerCourse.com  Fact Sheet for Healthcare Providers: SeriousBroker.it  This test is not yet approved or cleared by the [place] FDA and has been authorized for detection and/or diagnosis of SARS-CoV-2 by FDA under an Emergency Use Authorization (EUA). This EUA will remain in effect (meaning this test can be used) for the duration of the COVID-19 declaration under Section 564(b)(1) of the Act, 21 U.S.C. section 360bbb-3(b)(1), unless the authorization is terminated or revoked.     Resp Syncytial Virus by PCR NEGATIVE NEGATIVE Final    Comment: (NOTE) Fact Sheet for Patients: BloggerCourse.com  Fact Sheet for Healthcare Providers: SeriousBroker.it  This test is not yet approved or cleared by the [place] FDA and has been authorized for detection and/or diagnosis of SARS-CoV-2 by FDA under an Emergency Use Authorization (EUA). This EUA will remain in effect (meaning this test can be used) for the duration of the COVID-19 declaration under Section 564(b)(1) of the Act, 21 U.S.C. section 360bbb-3(b)(1), unless the authorization is terminated or revoked.  Performed at [facility], [address] [place], [place] [address]   Culture, blood (routine x 2)     Status: None (Preliminary result)   Collection Time: 12/13/23  8:20 AM   Specimen: BLOOD  Result Value Ref Range Status   Specimen Description BLOOD RIGHT ANTECUBITAL  Final   Special Requests   Final    BOTTLES DRAWN AEROBIC AND ANAEROBIC Blood Culture results may not be optimal due to an inadequate volume of blood received in culture bottles   Culture   Final    NO GROWTH 3 DAYS Performed at [facility], [address] [place], [place] [address]     Report Status PENDING  Incomplete  Culture, blood (routine x 2)     Status: None (Preliminary result)   Collection Time: 12/13/23  8:25 AM   Specimen: BLOOD  Result Value Ref Range Status   Specimen Description BLOOD LEFT ANTECUBITAL  Final   Special Requests   Final    AEROBIC BOTTLE ONLY Blood Culture results may not be optimal due to an inadequate volume of blood received in culture bottles   Culture   Final    NO GROWTH 3 DAYS Performed at [facility], [address], [place], [place] [address]    Report Status PENDING  Incomplete  Culture, Respiratory w Gram Stain     Status: None (Preliminary result)   Collection Time: 12/13/23 12:17 PM   Specimen: Tracheal Aspirate; Respiratory  Result Value Ref Range Status   Specimen Description TRACHEAL ASPIRATE  Final   Special Requests NONE  Final   Gram  Stain   Final    ABUNDANT WBC PRESENT, PREDOMINANTLY PMN FEW SQUAMOUS EPITHELIAL CELLS PRESENT FEW GRAM POSITIVE COCCI RARE GRAM NEGATIVE RODS MODERATE GRAM POSITIVE RODS    Culture   Final    CULTURE REINCUBATED FOR BETTER GROWTH Performed at [facility], [address], [place], [place] [address]    Report Status PENDING  Incomplete  MRSA Next Gen by PCR, Nasal     Status: None   Collection Time: 12/13/23  3:08 PM   Specimen: Nasal Mucosa; Nasal Swab  Result Value Ref Range Status   MRSA by PCR Next Gen NOT DETECTED NOT DETECTED Final    Comment: (NOTE) The GeneXpert MRSA Assay (FDA approved for NASAL specimens only), is one component of a comprehensive MRSA colonization surveillance program. It is not intended to diagnose MRSA infection nor to guide or monitor treatment for MRSA infections. Test performance is not FDA approved in patients less than 6 years old. Performed at [facility], [address] [place], [place] [address]   Culture, Respiratory w Gram Stain     Status: None (Preliminary result)   Collection Time: 12/14/23  9:15 AM   Specimen:  Bronchoalveolar Lavage; Respiratory  Result Value Ref Range Status   Specimen Description BRONCHIAL ALVEOLAR LAVAGE  Final   Special Requests NONE  Final   Gram Stain   Final    RARE WBC PRESENT, PREDOMINANTLY PMN NO ORGANISMS SEEN    Culture   Final    CULTURE REINCUBATED FOR BETTER GROWTH Performed at [facility], [address] [place], [place] [address]    Report Status PENDING  Incomplete    Lab Basic Metabolic Panel: Recent Labs  Lab 12/13/23 0840 12/13/23 1221 12/14/23 0257 12/14/23 1144 12/14/23 1709  0353  1414  NA 143   < > 134* 138 136 133* 134*  K 3.0*   < > 5.1 2.9* 3.4* 3.3* 4.2  CL 103   < > 104 105 103 101 103  CO2 27   < > 17* 21* 21* 22 22  GLUCOSE 139*   < > 192* 131* 117* 134* 109*  BUN 16   < > 15 14 13 11 11   CREATININE 1.03   < > 1.02 0.90 0.93 0.86 0.87  CALCIUM 9.0   < > 7.5* 7.3* 7.3* 7.2* 7.7*  MG 2.3  --  2.1 1.9 2.5* 2.0  --   PHOS  --   --  <1.0* 3.1 1.1* 3.3  --    < > = values in this interval not displayed.   Liver Function Tests: Recent Labs  Lab 12/13/23 0840 12/13/23 1508 12/14/23 0930  0353  AST 67* 203* 71* 35  ALT 54* 169* 101* 50*  ALKPHOS 146* 123 96 78  BILITOT 2.4* 2.7* 2.2* 2.5*  PROT 7.3 5.6* 5.0* 5.0*  ALBUMIN 3.7 2.7* 2.0* 3.0*   Recent Labs  Lab 12/13/23 0840  LIPASE 19   No results for input(s): "AMMONIA" in the last 168 hours. CBC: Recent Labs  Lab 12/13/23 0840 12/13/23 1221 12/14/23 0003 12/14/23 0257  1039  WBC 14.8*  --   --  18.4* 12.0*  NEUTROABS 13.0*  --   --   --   --   HGB 16.4 14.6 12.9* 13.7 10.2*  HCT 49.5 43.0 38.0* 40.7 28.7*  MCV 85.5  --   --  83.4 82.2  PLT 215  --   --  209 98*  Cardiac Enzymes: No results for input(s): "CKTOTAL", "CKMB", "CKMBINDEX", "TROPONINI" in the last 168 hours. Sepsis Labs: Recent Labs  Lab 12/13/23 0840 12/13/23 0858 12/14/23 0257 12/14/23 0856 12/14/23 1144  1039  WBC 14.8*  --  18.4*  --    --  12.0*  LATICACIDVEN  --  3.3*  --  1.8 1.4  --       Lorin Glass 12/16/2023, 1:11 PM
